# Patient Record
Sex: Female | Born: 2000
Health system: Southern US, Community
[De-identification: ages and names within clinical notes are randomized; demographics above are authoritative.]

## PROBLEM LIST (undated history)

## (undated) DIAGNOSIS — Z789 Other specified health status: Secondary | ICD-10-CM

## (undated) HISTORY — PX: NO PAST SURGERIES: SHX2092

---
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
## 2021-02-25 DIAGNOSIS — Z3490 Encounter for supervision of normal pregnancy, unspecified, unspecified trimester: Secondary | ICD-10-CM | POA: Insufficient documentation

## 2021-02-26 ENCOUNTER — Ambulatory Visit (INDEPENDENT_AMBULATORY_CARE_PROVIDER_SITE_OTHER): Payer: 59

## 2021-02-26 DIAGNOSIS — Z3490 Encounter for supervision of normal pregnancy, unspecified, unspecified trimester: Secondary | ICD-10-CM

## 2021-02-26 DIAGNOSIS — Z3482 Encounter for supervision of other normal pregnancy, second trimester: Secondary | ICD-10-CM

## 2021-02-26 DIAGNOSIS — Z3A16 16 weeks gestation of pregnancy: Secondary | ICD-10-CM

## 2021-02-26 MED ORDER — BLOOD PRESSURE KIT DEVI: 1.0000 | 0 refills | Status: DC

## 2021-02-26 NOTE — Progress Notes (Signed)
  Virtual Visit via Telephone Note  I connected with Becky Crosby on 02/26/21 at  2:00 PM EST by telephone and verified that I am speaking with the correct person using two identifiers.  Location: Patient: HOME Provider: [facility]   I discussed the limitations, risks, security and privacy concerns of performing an evaluation and management service by telephone and the availability of in person appointments. I also discussed with the patient that there may be a patient responsible charge related to this service. The patient expressed understanding and agreed to proceed.   History of Present Illness: PRENATAL INTAKE SUMMARY  Ms. Becky Crosby presents today New OB Nurse Interview.  OB History    Gravida  2   Para      Term      Preterm      AB  1   Living  0     SAB      IAB      Ectopic      Multiple      Live Births             I have reviewed the patient's medical, obstetrical, social, and family histories, medications, and available lab results.  SUBJECTIVE She has no unusual complaints   Observations/Objective: Initial nurse interview for history/labs (New OB)  EDD: 08/07/2021 GA: [redacted]w[redacted]d G2 P0 FHT: NA  GENERAL APPEARANCE: oriented to person, place and time  Assessment and Plan: Normal pregnancy Prenatal care: @ [facility] PHq-9=2 Pregnancy Risk Screening done Babyscripts ordered BP Cuff ordered Labs to be completed at next visit with [name] on 03/05/2021.   Follow Up Instructions:   I discussed the assessment and treatment plan with the patient. The patient was provided an opportunity to ask questions and all were answered. The patient agreed with the plan and demonstrated an understanding of the instructions.   The patient was advised to call back or seek an in-person evaluation if the symptoms worsen or if the condition fails to improve as anticipated.  I provided 20 minutes of non-face-to-face time during this encounter.   Maretta Bees, RMA

## 2021-03-05 ENCOUNTER — Other Ambulatory Visit (HOSPITAL_COMMUNITY)
Admission: RE | Admit: 2021-03-05 | Discharge: 2021-03-05 | Disposition: A | Discharge status: Home / Self Care | Payer: 59 | Source: Ambulatory Visit | Attending: Obstetrics and Gynecology | Admitting: Obstetrics and Gynecology

## 2021-03-05 ENCOUNTER — Other Ambulatory Visit (HOSPITAL_COMMUNITY): Payer: Self-pay | Admitting: Obstetrics and Gynecology

## 2021-03-05 ENCOUNTER — Ambulatory Visit (INDEPENDENT_AMBULATORY_CARE_PROVIDER_SITE_OTHER): Payer: 59 | Admitting: Obstetrics and Gynecology

## 2021-03-05 ENCOUNTER — Other Ambulatory Visit: Payer: Self-pay

## 2021-03-05 VITALS — BP 119/74 | HR 81 | Wt 196.0 lb

## 2021-03-05 DIAGNOSIS — Z31430 Encounter of female for testing for genetic disease carrier status for procreative management: Secondary | ICD-10-CM | POA: Diagnosis not present

## 2021-03-05 DIAGNOSIS — Z3402 Encounter for supervision of normal first pregnancy, second trimester: Secondary | ICD-10-CM | POA: Diagnosis not present

## 2021-03-05 DIAGNOSIS — Z3482 Encounter for supervision of other normal pregnancy, second trimester: Secondary | ICD-10-CM | POA: Diagnosis not present

## 2021-03-05 DIAGNOSIS — Z3480 Encounter for supervision of other normal pregnancy, unspecified trimester: Secondary | ICD-10-CM

## 2021-03-05 DIAGNOSIS — Z3A17 17 weeks gestation of pregnancy: Secondary | ICD-10-CM

## 2021-03-05 MED ORDER — PRENAISSANCE BALANCE 30-1-260 MG PO CAPS: 1.0000 | ORAL_CAPSULE | Freq: Every day | ORAL | 13 refills | Status: AC

## 2021-03-05 NOTE — Progress Notes (Signed)
Pt presents today for NOB visit. No complaints from patient.

## 2021-03-05 NOTE — Progress Notes (Signed)
  Subjective:    Becky Crosby is a G2P0010 [redacted]w[redacted]d being seen today for her first obstetrical visit.  Her obstetrical history is significant for teenage pregnancy. Patient does intend to breast feed. Pregnancy history fully reviewed.  Support system includes mom and boyfriend. Pregnancy unplanned but plans to keep baby.  Not Covid vaccinated but is strongly considering.  Mood is good, feeling a little overwhelmed about pregnancy but okay overall. Planning on getting GED.   No tobacco use. No MJ use. Denies any other substance use.    Patient reports no complaints.  Vitals:   03/05/21 0943  BP: 119/74  Pulse: 81  Weight: 196 lb (88.9 kg)    HISTORY: OB History  Gravida Para Term Preterm AB Living  2       1 0  SAB IAB Ectopic Multiple Live Births               # Outcome Date GA Lbr Len/2nd Weight Sex Delivery Anes PTL Lv  2 Current           1 AB            Past Medical History:  Diagnosis Date  . Constipation   . Medical history non-contributory    Past Surgical History:  Procedure Laterality Date  . NO PAST SURGERIES     No family history on file.   Exam     Skin: normal coloration and turgor, no rashes    Neurologic: oriented   Extremities: normal strength, tone, and muscle mass   HEENT PERRLA   Neck supple   Cardiovascular: regular rate and rhythm   Respiratory:  appears well, vitals normal, no respiratory distress, acyanotic, normal RR, ear and throat exam is normal, neck free of mass or lymphadenopathy, chest clear, no wheezing, crepitations, rhonchi, normal symmetric air entry   Abdomen: soft, non-tender; bowel sounds normal; no masses,  no organomegaly      Assessment:    Pregnancy: G2P0010 Patient Active Problem List   Diagnosis Date Noted  . Supervision of normal pregnancy 02/25/2021  . Constipation         Plan:    Supervision of normal Pregnancy, teenage pregnancy  -welcomed to office. Discussed expectations in second trimester. Mood  good overall. Taking PNV.  Initial labs drawn. Problem list reviewed and updated. Genetic Screening discussed NIPS, AFP,  requested.  Ultrasound discussed; fetal survey: requested.  Follow up in 4 weeks.    Gita Kudo 03/05/2021

## 2021-03-05 NOTE — Patient Instructions (Signed)

## 2021-03-06 LAB — CERVICOVAGINAL ANCILLARY ONLY
Chlamydia: NEGATIVE
Comment: NEGATIVE
Comment: NEGATIVE
Comment: NORMAL
Neisseria Gonorrhea: NEGATIVE
Trichomonas: NEGATIVE

## 2021-03-07 LAB — URINE CULTURE, OB REFLEX

## 2021-03-07 LAB — CULTURE, OB URINE

## 2021-03-09 LAB — CBC/D/PLT+RPR+RH+ABO+RUB AB...
Basophils Absolute: 0.1 10*3/uL (ref 0.0–0.2)
Basos: 1 %
EOS (ABSOLUTE): 0.2 10*3/uL (ref 0.0–0.4)
Eos: 2 %
HCV Ab: <0.1 s/co ratio (ref 0.0–0.9)
HIV Screen 4th Generation wRfx: NONREACTIVE
Hematocrit: 35.8 % (ref 34.0–46.6)
Hemoglobin: 12.2 g/dL (ref 11.1–15.9)
Hepatitis B Surface Ag: NEGATIVE
Immature Grans (Abs): 0 10*3/uL (ref 0.0–0.1)
Immature Granulocytes: 0 %
Lymphocytes Absolute: 2.7 10*3/uL (ref 0.7–3.1)
Lymphs: 32 %
MCH: 29.6 pg (ref 26.6–33.0)
MCHC: 34.1 g/dL (ref 31.5–35.7)
MCV: 87 fL (ref 79–97)
Monocytes Absolute: 0.5 10*3/uL (ref 0.1–0.9)
Monocytes: 6 %
Neutrophils Absolute: 4.9 10*3/uL (ref 1.4–7.0)
Neutrophils: 59 %
Platelets: 268 10*3/uL (ref 150–450)
RBC: 4.12 x10E6/uL (ref 3.77–5.28)
RDW: 13.2 % (ref 11.7–15.4)
RPR Ser Ql: NONREACTIVE
Rh Factor: POSITIVE
Rubella Antibodies, IGG: 7.56 index (ref >0.99)
WBC: 8.4 10*3/uL (ref 3.4–10.8)

## 2021-03-09 LAB — AFP, SERUM, OPEN SPINA BIFIDA
AFP MoM: 1.16
AFP Value: 43.2 ng/mL
Gest. Age on Collection Date: 17.3 weeks
Maternal Age At EDD: 19 yr
OSBR Risk 1 IN: 10000
Test Results:: NEGATIVE
Weight: 196 [lb_av]

## 2021-03-09 LAB — AB SCR+ANTIBODY ID: Antibody Screen: POSITIVE — AB

## 2021-03-09 LAB — HCV INTERPRETATION

## 2021-03-11 ENCOUNTER — Encounter: Payer: Self-pay | Admitting: Obstetrics and Gynecology

## 2021-03-15 ENCOUNTER — Encounter: Payer: Self-pay | Admitting: Obstetrics and Gynecology

## 2021-03-22 ENCOUNTER — Ambulatory Visit: Payer: 59 | Attending: Obstetrics and Gynecology

## 2021-03-22 ENCOUNTER — Other Ambulatory Visit: Payer: Self-pay

## 2021-03-22 DIAGNOSIS — Z3402 Encounter for supervision of normal first pregnancy, second trimester: Secondary | ICD-10-CM | POA: Diagnosis not present

## 2021-03-25 ENCOUNTER — Other Ambulatory Visit: Payer: Self-pay | Admitting: *Deleted

## 2021-03-25 DIAGNOSIS — O4402 Placenta previa specified as without hemorrhage, second trimester: Secondary | ICD-10-CM

## 2021-04-02 ENCOUNTER — Other Ambulatory Visit: Payer: Self-pay

## 2021-04-02 ENCOUNTER — Ambulatory Visit (INDEPENDENT_AMBULATORY_CARE_PROVIDER_SITE_OTHER): Payer: 59 | Admitting: Nurse Practitioner

## 2021-04-02 VITALS — BP 119/71 | HR 7 | Wt 201.0 lb

## 2021-04-02 DIAGNOSIS — Z3490 Encounter for supervision of normal pregnancy, unspecified, unspecified trimester: Secondary | ICD-10-CM

## 2021-04-02 DIAGNOSIS — Z3A21 21 weeks gestation of pregnancy: Secondary | ICD-10-CM

## 2021-04-02 NOTE — Progress Notes (Signed)
    Subjective:  Becky Crosby is a 20 y.o. G2P0010 at [redacted]w[redacted]d being seen today for ongoing prenatal care.  She is currently monitored for the following issues for this low-risk pregnancy and has Constipation; Supervision of normal pregnancy; and Intrauterine pregnancy in teenager on their problem list.  Patient reports no complaints.  Contractions: Not present. Vag. Bleeding: None.  Movement: Present. Denies leaking of fluid.   The following portions of the patient's history were reviewed and updated as appropriate: allergies, current medications, past family history, past medical history, past social history, past surgical history and problem list. Problem list updated.  Objective:   Vitals:   04/02/21 0944  BP: 119/71  Pulse: (!) 7  Weight: 201 lb (91.2 kg)    Fetal Status: Fetal Heart Rate (bpm): 152   Movement: Present     General:  Alert, oriented and cooperative. Patient is in no acute distress.  Skin: Skin is warm and dry. No rash noted.   Cardiovascular: Normal heart rate noted  Respiratory: Normal respiratory effort, no problems with respiration noted  Abdomen: Soft, gravid, appropriate for gestational age. Pain/Pressure: Absent     Pelvic:  Cervical exam deferred        Extremities: Normal range of motion.  Edema: None  Mental Status: Normal mood and affect. Normal behavior. Normal judgment and thought content.   Urinalysis:      Assessment and Plan:  Pregnancy: G2P0010 at [redacted]w[redacted]d  1. Encounter for supervision of normal pregnancy, antepartum, unspecified gravidity Nausea has improved - never took Diclegis Constipation has resolve - BM daily Reviewed signing up for childbirth and breastfeeding classes Has low lying placenta and scheduled for repeat US - reviewed pelvic rest for now  - AFP, Serum, Open Spina Bifida   Preterm labor symptoms and general obstetric precautions including but not limited to vaginal bleeding, contractions, leaking of fluid and fetal movement  were reviewed in detail with the patient. Please refer to After Visit Summary for other counseling recommendations.  Return in about 4 weeks (around 04/30/2021) for in person ROB.  Nolene Bernheim, RN, MSN, NP-BC Nurse Practitioner, [facility] [employer], [facility] 04/02/2021 10:21 AM

## 2021-04-02 NOTE — Progress Notes (Signed)
ROB 21w  CC: None       

## 2021-04-02 NOTE — Patient Instructions (Addendum)
Check [facility] for information on classes for childbirth and breastfeeding   Second Trimester of Pregnancy  The second trimester of pregnancy is from week 13 through week 27. This is also called months 4 through 6 of pregnancy. This is often the time when you feel your best. During the second trimester:  Morning sickness is less or has stopped.  You may have more energy.  You may feel hungry more often. At this time, your unborn baby (fetus) is growing very fast. At the end of the sixth month, the unborn baby may be up to 12 inches long and weigh about 1 pounds. You will likely start to feel the baby move between 16 and 20 weeks of pregnancy. Body changes during your second trimester Your body continues to go through many changes during this time. The changes vary and generally return to normal after the baby is born. Physical changes  You will gain more weight.  You may start to get stretch marks on your hips, belly (abdomen), and breasts.  Your breasts will grow and may hurt.  Dark spots or blotches may develop on your face.  A dark line from your belly button to the pubic area (linea nigra) may appear.  You may have changes in your hair. Health changes  You may have headaches.  You may have heartburn.  You may have trouble pooping (constipation).  You may have hemorrhoids or swollen, bulging veins (varicose veins).  Your gums may bleed.  You may pee (urinate) more often.  You may have back pain. Follow these instructions at home: Medicines  Take over-the-counter and prescription medicines only as told by your doctor. Some medicines are not safe during pregnancy.  Take a prenatal vitamin that contains at least 600 micrograms (mcg) of folic acid. Eating and drinking  Eat healthy meals that include: ? Fresh fruits and vegetables. ? Whole grains. ? Good sources of protein, such as meat, eggs, or tofu. ? Low-fat dairy products.  Avoid raw meat and  unpasteurized juice, milk, and cheese.  You may need to take these actions to prevent or treat trouble pooping: ? Drink enough fluids to keep your pee (urine) pale yellow. ? Eat foods that are high in fiber. These include beans, whole grains, and fresh fruits and vegetables. ? Limit foods that are high in fat and sugar. These include fried or sweet foods. Activity  Exercise only as told by your doctor. Most people can do their usual exercise during pregnancy. Try to exercise for 30 minutes at least 5 days a week.  Stop exercising if you have pain or cramps in your belly or lower back.  Do not exercise if it is too hot or too humid, or if you are in a place of great height (high altitude).  Avoid heavy lifting.  If you choose to, you may have sex unless your doctor tells you not to. Relieving pain and discomfort  Wear a good support bra if your breasts are sore.  Take warm water baths (sitz baths) to soothe pain or discomfort caused by hemorrhoids. Use hemorrhoid cream if your doctor approves.  Rest with your legs raised (elevated) if you have leg cramps or low back pain.  If you develop bulging veins in your legs: ? Wear support hose as told by your doctor. ? Raise your feet for 15 minutes, 3-4 times a day. ? Limit salt in your food. Safety  Wear your seat belt at all times when you are in a car.  Talk with your doctor if someone is hurting you or yelling at you a lot. Lifestyle  Do not use hot tubs, steam rooms, or saunas.  Do not douche. Do not use tampons or scented sanitary pads.  Avoid cat litter boxes and soil used by cats. These carry germs that can harm your baby and can cause a loss of your baby by miscarriage or stillbirth.  Do not use herbal medicines, illegal drugs, or medicines that are not approved by your doctor. Do not drink alcohol.  Do not smoke or use any products that contain nicotine or tobacco. If you need help quitting, ask your doctor. General  instructions  Keep all follow-up visits. This is important.  Ask your doctor about local prenatal classes.  Ask your doctor about the right foods to eat or for help finding a counselor. Where to find more information  American Pregnancy Association: americanpregnancy.org  [employer]: www.acog.org  Office on [employer] [facility]: [employer] Contact a doctor if:  You have a headache that does not go away when you take medicine.  You have changes in how you see, or you see spots in front of your eyes.  You have mild cramps, pressure, or pain in your lower belly.  You continue to feel like you may vomit (nauseous), you vomit, or you have watery poop (diarrhea).  You have bad-smelling fluid coming from your vagina.  You have pain when you pee or your pee smells bad.  You have very bad swelling of your face, hands, ankles, feet, or legs.  You have a fever. Get help right away if:  You are leaking fluid from your vagina.  You have spotting or bleeding from your vagina.  You have very bad belly cramping or pain.  You have trouble breathing.  You have chest pain.  You faint.  You have not felt your baby move for the time period told by your doctor.  You have new or increased pain, swelling, or redness in an arm or leg. Summary  The second trimester of pregnancy is from week 13 through week 27 (months 4 through 6).  Eat healthy meals.  Exercise as told by your doctor. Most people can do their usual exercise during pregnancy.  Do not use herbal medicines, illegal drugs, or medicines that are not approved by your doctor. Do not drink alcohol.  Call your doctor if you get sick or if you notice anything unusual about your pregnancy. This information is not intended to replace advice given to you by your health care provider. Make sure you discuss any questions you have with your health care provider. Document Revised:  05/16/2020 Document Reviewed: 03/22/2020 Elsevier Patient Education  2021 [employer].

## 2021-04-04 LAB — AFP, SERUM, OPEN SPINA BIFIDA
AFP MoM: 0.97
AFP Value: 62.6 ng/mL
Gest. Age on Collection Date: 21.2 weeks
Maternal Age At EDD: 19 yr
OSBR Risk 1 IN: 10000
Test Results:: NEGATIVE
Weight: 201 [lb_av]

## 2021-04-19 ENCOUNTER — Ambulatory Visit: Payer: 59 | Attending: Obstetrics and Gynecology

## 2021-04-19 ENCOUNTER — Other Ambulatory Visit: Payer: Self-pay

## 2021-04-19 ENCOUNTER — Ambulatory Visit: Payer: 59 | Admitting: *Deleted

## 2021-04-19 ENCOUNTER — Encounter: Payer: Self-pay | Admitting: *Deleted

## 2021-04-19 VITALS — BP 121/54 | HR 63

## 2021-04-19 DIAGNOSIS — Z3A23 23 weeks gestation of pregnancy: Secondary | ICD-10-CM

## 2021-04-19 DIAGNOSIS — O4402 Placenta previa specified as without hemorrhage, second trimester: Secondary | ICD-10-CM

## 2021-04-22 ENCOUNTER — Other Ambulatory Visit: Payer: Self-pay | Admitting: *Deleted

## 2021-05-02 ENCOUNTER — Ambulatory Visit (INDEPENDENT_AMBULATORY_CARE_PROVIDER_SITE_OTHER): Payer: 59 | Admitting: Obstetrics and Gynecology

## 2021-05-02 ENCOUNTER — Encounter: Payer: Self-pay | Admitting: Obstetrics and Gynecology

## 2021-05-02 ENCOUNTER — Other Ambulatory Visit: Payer: Self-pay

## 2021-05-02 ENCOUNTER — Other Ambulatory Visit (HOSPITAL_COMMUNITY): Payer: Self-pay

## 2021-05-02 VITALS — BP 116/71 | HR 79 | Wt 215.0 lb

## 2021-05-02 DIAGNOSIS — O2602 Excessive weight gain in pregnancy, second trimester: Secondary | ICD-10-CM

## 2021-05-02 DIAGNOSIS — Z3402 Encounter for supervision of normal first pregnancy, second trimester: Secondary | ICD-10-CM

## 2021-05-02 MED ORDER — CITRANATAL BLOOM 90-1 MG PO TABS
1.0000 | ORAL_TABLET | Freq: Every day | ORAL | 11 refills | Status: DC
  Filled 2021-05-02: qty 30, 30d supply, fill #0

## 2021-05-02 NOTE — Progress Notes (Signed)
ROB is taking the CitraNatal PNV and she is feeling better.

## 2021-05-02 NOTE — Progress Notes (Signed)
   PRENATAL VISIT NOTE  Subjective:  Becky Crosby is a 20 y.o. G2P0010 at [redacted]w[redacted]d being seen today for ongoing prenatal care.  She is currently monitored for the following issues for this low-risk pregnancy and has Constipation; Supervision of normal pregnancy; and Intrauterine pregnancy in teenager on their problem list.  Patient reports no complaints.  Contractions: Not present. Vag. Bleeding: None.  Movement: Present. Denies leaking of fluid.   The following portions of the patient's history were reviewed and updated as appropriate: allergies, current medications, past family history, past medical history, past social history, past surgical history and problem list.   Objective:   Vitals:   05/02/21 1437  BP: 116/71  Pulse: 79  Weight: 215 lb (97.5 kg)    Fetal Status: Fetal Heart Rate (bpm): 142 Fundal Height: 26 cm Movement: Present     General:  Alert, oriented and cooperative. Patient is in no acute distress.  Skin: Skin is warm and dry. No rash noted.   Cardiovascular: Normal heart rate noted  Respiratory: Normal respiratory effort, no problems with respiration noted  Abdomen: Soft, gravid, appropriate for gestational age.  Pain/Pressure: Absent     Pelvic: Cervical exam deferred        Extremities: Normal range of motion.  Edema: Trace  Mental Status: Normal mood and affect. Normal behavior. Normal judgment and thought content.   Assessment and Plan:  Pregnancy: G2P0010 at [redacted]w[redacted]d 1. Encounter for supervision of normal first pregnancy in second trimester Patient is doing well  Third trimester labs with glucola next visit Reviewed last ultrasound and resolution of low-lying placenta Follow up growth ultrasound scheduled  2. Excessive weight gain during pregnancy in second trimester Reviewed recommended weight gain in pregnancy Discussed increasing risk of GDM, and hypertensive disease in pregnancy  Preterm labor symptoms and general obstetric precautions including but  not limited to vaginal bleeding, contractions, leaking of fluid and fetal movement were reviewed in detail with the patient. Please refer to After Visit Summary for other counseling recommendations.   Return in about 4 weeks (around 05/30/2021) for in person, ROB, Low risk, 2 hr glucola next visit.  Future Appointments  Date Time Provider Department Center  05/24/2021 12:30 PM [facility] NURSE [facility]  05/24/2021 12:45 PM [facility]    Catalina Antigua, MD

## 2021-05-10 ENCOUNTER — Other Ambulatory Visit (HOSPITAL_COMMUNITY): Payer: Self-pay

## 2021-05-24 ENCOUNTER — Other Ambulatory Visit: Payer: Self-pay

## 2021-05-24 ENCOUNTER — Encounter: Payer: Self-pay | Admitting: *Deleted

## 2021-05-24 ENCOUNTER — Ambulatory Visit: Payer: 59 | Admitting: *Deleted

## 2021-05-24 ENCOUNTER — Ambulatory Visit: Payer: 59 | Attending: Obstetrics

## 2021-05-24 VITALS — BP 125/61 | HR 70

## 2021-05-24 DIAGNOSIS — O365930 Maternal care for other known or suspected poor fetal growth, third trimester, not applicable or unspecified: Secondary | ICD-10-CM

## 2021-05-24 DIAGNOSIS — Z362 Encounter for other antenatal screening follow-up: Secondary | ICD-10-CM

## 2021-05-24 DIAGNOSIS — Z3A28 28 weeks gestation of pregnancy: Secondary | ICD-10-CM | POA: Diagnosis not present

## 2021-05-30 ENCOUNTER — Other Ambulatory Visit: Payer: Self-pay

## 2021-05-30 ENCOUNTER — Ambulatory Visit (INDEPENDENT_AMBULATORY_CARE_PROVIDER_SITE_OTHER): Payer: 59

## 2021-05-30 ENCOUNTER — Other Ambulatory Visit: Payer: 59

## 2021-05-30 VITALS — BP 116/64 | HR 57 | Wt 220.2 lb

## 2021-05-30 DIAGNOSIS — Z7689 Persons encountering health services in other specified circumstances: Secondary | ICD-10-CM | POA: Diagnosis not present

## 2021-05-30 DIAGNOSIS — Z3402 Encounter for supervision of normal first pregnancy, second trimester: Secondary | ICD-10-CM

## 2021-05-30 DIAGNOSIS — Z3A29 29 weeks gestation of pregnancy: Secondary | ICD-10-CM

## 2021-05-30 DIAGNOSIS — Z23 Encounter for immunization: Secondary | ICD-10-CM

## 2021-05-30 NOTE — Patient Instructions (Signed)

## 2021-05-30 NOTE — Progress Notes (Signed)
   LOW-RISK PREGNANCY OFFICE VISIT  Patient name: Becky Crosby MRN [id number]  Date of birth: [birth year] Chief Complaint:   Routine Prenatal Visit  Subjective:   Becky Crosby is a 20 y.o. G58P0010 female at [redacted]w[redacted]d with an Estimated Date of Delivery: 08/11/21 being seen today for ongoing management of a low-risk pregnancy aeb has Constipation; Supervision of normal pregnancy; and Intrauterine pregnancy in teenager on their problem list.  Patient presents today with no complaints.  Patient endorses fetal movement. Patient denies abdominal cramping or contractions.  Patient denies vaginal concerns including abnormal discharge, leaking of fluid, and bleeding.  Contractions: Not present. Vag. Bleeding: None.  Movement: Present.  Patient questions costs of circumcision.  States she was told it would be around $500.  Reviewed past medical,surgical, social, obstetrical and family history as well as problem list, medications and allergies.  Objective   Vitals:   05/30/21 0952  BP: 116/64  Pulse: (!) 57  Weight: 220 lb 3.2 oz (99.9 kg)  There is no height or weight on file to calculate BMI.  Total Weight Gain:96 lb 3.2 oz (43.6 kg)         Physical Examination:   General appearance: Well appearing, and in no distress  Mental status: Alert, oriented to person, place, and time  Skin: Warm & dry  Cardiovascular: Normal heart rate noted  Respiratory: Normal respiratory effort, no distress  Abdomen: Soft, gravid, nontender, AGA with Fundal Height: 30 cm  Pelvic: Cervical exam deferred           Extremities: Edema: None  Fetal Status:    Movement: Present   No results found for this or any previous visit (from the past 24 hour(s)).  Assessment & Plan:  Low-risk pregnancy of a 20 y.o., G2P0010 at [redacted]w[redacted]d with an Estimated Date of Delivery: 08/11/21   1. Encounter for supervision of normal first pregnancy in second trimester -Anticipatory guidance for upcoming appts. -Patient to schedule next  appt in 2 weeks for an in-person visit.   2. [redacted] weeks gestation of pregnancy -Doing well overall. -Reviewed Glucola blood draw procedures and labs which also include check of iron level and RPR/HIV.  -Discussed how results of GTT are handled including diabetic education and BS testing for abnormal results and routine care for normal results.  -Reassured that her private [facility])  insurance will cover costs of circumcision less any deductibles.    Meds: No orders of the defined types were placed in this encounter.  Labs/procedures today:  Lab Orders  Glucose Tolerance, 2 Hours w/1 Hour  CBC  HIV antibody (with reflex)  RPR     Reviewed: Preterm labor symptoms and general obstetric precautions including but not limited to vaginal bleeding, contractions, leaking of fluid and fetal movement were reviewed in detail with the patient.  All questions were answered.  Follow-up: Return in about 2 weeks (around 06/13/2021) for LROB.  Orders Placed This Encounter  Procedures   Tdap vaccine greater than or equal to 7yo IM   Glucose Tolerance, 2 Hours w/1 Hour   CBC   HIV antibody (with reflex)   RPR   Cherre Robins MSN, CNM 05/30/2021

## 2021-05-30 NOTE — Progress Notes (Signed)
ROB 29.4wks GTT today TDAP offered and accepted Routine Labs pended. Depression and Anxiety screen negative

## 2021-05-31 LAB — CBC
Hematocrit: 36.5 % (ref 34.0–46.6)
Hemoglobin: 12.3 g/dL (ref 11.1–15.9)
MCH: 30.4 pg (ref 26.6–33.0)
MCHC: 33.7 g/dL (ref 31.5–35.7)
MCV: 90 fL (ref 79–97)
Platelets: 238 10*3/uL (ref 150–450)
RBC: 4.05 x10E6/uL (ref 3.77–5.28)
RDW: 12.1 % (ref 11.7–15.4)
WBC: 9.3 10*3/uL (ref 3.4–10.8)

## 2021-05-31 LAB — GLUCOSE TOLERANCE, 2 HOURS W/ 1HR
Glucose, 1 hour: 72 mg/dL (ref 65–179)
Glucose, 2 hour: 90 mg/dL (ref 65–152)
Glucose, Fasting: 74 mg/dL (ref 65–91)

## 2021-05-31 LAB — HIV ANTIBODY (ROUTINE TESTING W REFLEX): HIV Screen 4th Generation wRfx: NONREACTIVE

## 2021-05-31 LAB — RPR: RPR Ser Ql: NONREACTIVE

## 2021-06-13 ENCOUNTER — Other Ambulatory Visit: Payer: Self-pay

## 2021-06-13 ENCOUNTER — Ambulatory Visit (INDEPENDENT_AMBULATORY_CARE_PROVIDER_SITE_OTHER): Payer: 59

## 2021-06-13 VITALS — BP 119/65 | HR 72 | Wt 220.0 lb

## 2021-06-13 DIAGNOSIS — Z3480 Encounter for supervision of other normal pregnancy, unspecified trimester: Secondary | ICD-10-CM

## 2021-06-13 DIAGNOSIS — Z3A31 31 weeks gestation of pregnancy: Secondary | ICD-10-CM

## 2021-06-13 DIAGNOSIS — M549 Dorsalgia, unspecified: Secondary | ICD-10-CM

## 2021-06-13 DIAGNOSIS — O26893 Other specified pregnancy related conditions, third trimester: Secondary | ICD-10-CM

## 2021-06-13 NOTE — Progress Notes (Signed)
Pt states she is having some left rib/back pain. Pt denies any urinary problems.  Pt states she has some heartburn/reflux with certain foods.

## 2021-06-13 NOTE — Progress Notes (Signed)
   LOW-RISK PREGNANCY OFFICE VISIT  Patient name: Becky Crosby MRN [id number]  Date of birth: [birth year] Chief Complaint:   Routine Prenatal Visit  Subjective:   Becky Crosby is a 20 y.o. G81P0010 female at [redacted]w[redacted]d with an Estimated Date of Delivery: 08/11/21 being seen today for ongoing management of a low-risk pregnancy aeb has Constipation; Supervision of normal pregnancy; and Intrauterine pregnancy in teenager on their problem list.  Patient presents today with heartburn.  She states that "sometimes when I eat certain things I feel like it is not going down... like it is stuck." She also reports some back pain that is located "in the back of my rib."  She reports the pain lasts for "hours" and feels "like a knot inside somewhere." She reports it is relieved with laying down. Patient endorses fetal movement. Patient denies abdominal cramping or contractions.  Patient denies vaginal concerns including abnormal discharge, leaking of fluid, and bleeding.  Contractions: Not present. Vag. Bleeding: None.  Movement: Present.  Reviewed past medical,surgical, social, obstetrical and family history as well as problem list, medications and allergies.  Objective   Vitals:   06/13/21 1440  BP: 119/65  Pulse: 72  Weight: 220 lb (99.8 kg)  There is no height or weight on file to calculate BMI.  Total Weight Gain:24 lb (10.9 kg)         Physical Examination:   General appearance: Well appearing, and in no distress  Mental status: Alert, oriented to person, place, and time  Skin: Warm & dry  Cardiovascular: Normal heart rate noted  Respiratory: Normal respiratory effort, no distress  Abdomen: Soft, gravid, nontender, AGA with Fundal Height: 32 cm  Pelvic: Cervical exam deferred           Extremities:    Fetal Status: Fetal Heart Rate (bpm): 140  Movement: Present   No results found for this or any previous visit (from the past 24 hour(s)).  Assessment & Plan:  Low-risk pregnancy of a 20 y.o.,  G2P0010 at [redacted]w[redacted]d with an Estimated Date of Delivery: 08/11/21   1. Supervision of other normal pregnancy, antepartum -Anticipatory guidance for upcoming appts. -Patient to schedule next appt in 2-3 weeks for an in-person or virtual visit as desired.   2. [redacted] weeks gestation of pregnancy -Doing well overall. -Patient expresses some anxiety with her mother, who is present today, being [place] of [place] from [place]. -Reassured that most first babies come after due date, but encouraged to have back up plan.    3. Back pain during pregnancy in third trimester -Reviewed complaints. -Informed of non-pharmacological methods to reduce back pain. -Encouraged to monitor and report any worsening symptoms or those not relieved with position changes. -Discussed usage of maternity belt/band.     Meds: No orders of the defined types were placed in this encounter.  Labs/procedures today:  Lab Orders  No laboratory test(s) ordered today     Reviewed: Preterm labor symptoms and general obstetric precautions including but not limited to vaginal bleeding, contractions, leaking of fluid and fetal movement were reviewed in detail with the patient.  All questions were answered.  Follow-up: Return in about 2 weeks (around 06/27/2021), or 2-3, for LROB.  No orders of the defined types were placed in this encounter.  Cherre Robins MSN, CNM 06/13/2021

## 2021-06-27 ENCOUNTER — Encounter: Payer: 59 | Admitting: Advanced Practice Midwife

## 2021-07-01 ENCOUNTER — Other Ambulatory Visit: Payer: Self-pay

## 2021-07-01 ENCOUNTER — Ambulatory Visit (INDEPENDENT_AMBULATORY_CARE_PROVIDER_SITE_OTHER): Payer: 59 | Admitting: Advanced Practice Midwife

## 2021-07-01 VITALS — BP 124/71 | HR 66 | Wt 225.0 lb

## 2021-07-01 DIAGNOSIS — Z3483 Encounter for supervision of other normal pregnancy, third trimester: Secondary | ICD-10-CM

## 2021-07-01 DIAGNOSIS — Z3480 Encounter for supervision of other normal pregnancy, unspecified trimester: Secondary | ICD-10-CM

## 2021-07-01 DIAGNOSIS — Z3A34 34 weeks gestation of pregnancy: Secondary | ICD-10-CM

## 2021-07-01 NOTE — Progress Notes (Signed)
Patient presents for a ROB. Patient has no concerns today.

## 2021-07-01 NOTE — Progress Notes (Signed)
   PRENATAL VISIT NOTE  Subjective:  Becky Crosby is a 20 y.o. G2P0010 at [redacted]w[redacted]d being seen today for ongoing prenatal care.  She is currently monitored for the following issues for this low-risk pregnancy and has Constipation; Supervision of normal pregnancy; and Intrauterine pregnancy in teenager on their problem list.  Patient reports no complaints.  Contractions: Not present. Vag. Bleeding: None.  Movement: Present. Denies leaking of fluid.   The following portions of the patient's history were reviewed and updated as appropriate: allergies, current medications, past family history, past medical history, past social history, past surgical history and problem list.   Objective:   Vitals:   07/01/21 1612  BP: 124/71  Pulse: 66  Weight: 225 lb (102.1 kg)    Fetal Status:     Movement: Present     General:  Alert, oriented and cooperative. Patient is in no acute distress.  Skin: Skin is warm and dry. No rash noted.   Cardiovascular: Normal heart rate noted  Respiratory: Normal respiratory effort, no problems with respiration noted  Abdomen: Soft, gravid, appropriate for gestational age.  Pain/Pressure: Absent     Pelvic: Cervical exam deferred        Extremities: Normal range of motion.  Edema: None  Mental Status: Normal mood and affect. Normal behavior. Normal judgment and thought content.   Assessment and Plan:  Pregnancy: G2P0010 at [redacted]w[redacted]d 1. Supervision of other normal pregnancy, antepartum --Anticipatory guidance about next visits/weeks of pregnancy given. --EFW 5.5 lbs by [place] at pt request today, appropriate for gestational age --Next visit in 2 weeks  2. [redacted] weeks gestation of pregnancy   Preterm labor symptoms and general obstetric precautions including but not limited to vaginal bleeding, contractions, leaking of fluid and fetal movement were reviewed in detail with the patient. Please refer to After Visit Summary for other counseling recommendations.   Return  in about 2 weeks (around 07/15/2021).  Future Appointments  Date Time Provider Department Center  07/15/2021  3:40 PM [place], Wilmer Floor, CNM [facility] None    Sharen Counter, CNM

## 2021-07-15 ENCOUNTER — Encounter: Payer: 59 | Admitting: Advanced Practice Midwife

## 2021-07-30 ENCOUNTER — Ambulatory Visit (INDEPENDENT_AMBULATORY_CARE_PROVIDER_SITE_OTHER): Payer: 59 | Admitting: Obstetrics and Gynecology

## 2021-07-30 ENCOUNTER — Other Ambulatory Visit (HOSPITAL_COMMUNITY)
Admission: RE | Admit: 2021-07-30 | Discharge: 2021-07-30 | Disposition: A | Discharge status: Home / Self Care | Payer: 59 | Source: Ambulatory Visit | Attending: Advanced Practice Midwife | Admitting: Advanced Practice Midwife

## 2021-07-30 ENCOUNTER — Other Ambulatory Visit: Payer: Self-pay

## 2021-07-30 VITALS — BP 105/75 | HR 60 | Wt 235.0 lb

## 2021-07-30 DIAGNOSIS — Z3480 Encounter for supervision of other normal pregnancy, unspecified trimester: Secondary | ICD-10-CM | POA: Diagnosis not present

## 2021-07-30 NOTE — Progress Notes (Signed)
   PRENATAL VISIT NOTE  Subjective:  Becky Crosby is a 20 y.o. G2P0010 at [redacted]w[redacted]d being seen today for ongoing prenatal care.  She is currently monitored for the following issues for this low-risk pregnancy and has Constipation; Supervision of normal pregnancy; and Intrauterine pregnancy in teenager on their problem list.  Patient reports no complaints.  Contractions: Irritability. Vag. Bleeding: None.  Movement: Present. Denies leaking of fluid.   The following portions of the patient's history were reviewed and updated as appropriate: allergies, current medications, past family history, past medical history, past social history, past surgical history and problem list.   Objective:   Vitals:   07/30/21 0946  BP: 105/75  Pulse: 60  Weight: 235 lb (106.6 kg)    Fetal Status: Fetal Heart Rate (bpm): 141 Fundal Height: 38 cm Movement: Present  Presentation: Vertex  General:  Alert, oriented and cooperative. Patient is in no acute distress.  Skin: Skin is warm and dry. No rash noted.   Cardiovascular: Normal heart rate noted  Respiratory: Normal respiratory effort, no problems with respiration noted  Abdomen: Soft, gravid, appropriate for gestational age.  Pain/Pressure: Present     Pelvic: Dilation: Fingertip Effacement (%): 50 Station: -3  Extremities: Normal range of motion.  Edema: None  Mental Status: Normal mood and affect. Normal behavior. Normal judgment and thought content.   Assessment and Plan:  Pregnancy: G2P0010 at [redacted]w[redacted]d  1. Supervision of other normal pregnancy, antepartum  -Add magnesium at night for rest, muscle pain -1 week visit. -GC/ GBS today, missed last patient  - Cervicovaginal ancillary only( [place]) - Culture, beta strep (group b only)   Term labor symptoms and general obstetric precautions including but not limited to vaginal bleeding, contractions, leaking of fluid and fetal movement were reviewed in detail with the patient. Please refer to After  Visit Summary for other counseling recommendations.   No follow-ups on file.  Future Appointments  Date Time Provider Department Center  08/07/2021  9:35 AM Warden Fillers, MD [facility] None    Venia Carbon, NP

## 2021-07-30 NOTE — Progress Notes (Signed)
ROB 38.2 wks Missed 36 wk appt due to Covid  Needs GBS and GC/CC. Requests SVE.

## 2021-07-31 ENCOUNTER — Other Ambulatory Visit: Payer: Self-pay | Admitting: Obstetrics and Gynecology

## 2021-07-31 ENCOUNTER — Telehealth: Payer: Self-pay | Admitting: Obstetrics and Gynecology

## 2021-07-31 ENCOUNTER — Encounter: Payer: Self-pay | Admitting: Obstetrics and Gynecology

## 2021-07-31 DIAGNOSIS — A749 Chlamydial infection, unspecified: Secondary | ICD-10-CM | POA: Insufficient documentation

## 2021-07-31 LAB — CERVICOVAGINAL ANCILLARY ONLY
Chlamydia: POSITIVE — AB
Comment: NEGATIVE
Comment: NORMAL
Neisseria Gonorrhea: NEGATIVE

## 2021-07-31 MED ORDER — AZITHROMYCIN 500 MG PO TABS
1000.0000 mg | ORAL_TABLET | Freq: Once | ORAL | 0 refills | Status: DC
  Filled 2021-07-31: qty 2, 1d supply, fill #0

## 2021-07-31 MED ORDER — AZITHROMYCIN 500 MG PO TABS: 1000.0000 mg | ORAL_TABLET | Freq: Once | ORAL | 0 refills | Status: AC

## 2021-07-31 NOTE — Telephone Encounter (Signed)
+   chlamydia  Attempted to call patient with no answer. Message left to return call to [phone]- [facility] Rx: Azithromycin    Deshara Rossi, Harolyn Rutherford, NP 07/31/2021 7:01 PM

## 2021-08-01 ENCOUNTER — Other Ambulatory Visit (HOSPITAL_COMMUNITY): Payer: Self-pay

## 2021-08-02 ENCOUNTER — Encounter: Payer: Self-pay | Admitting: Obstetrics and Gynecology

## 2021-08-02 DIAGNOSIS — B951 Streptococcus, group B, as the cause of diseases classified elsewhere: Secondary | ICD-10-CM | POA: Insufficient documentation

## 2021-08-02 LAB — CULTURE, BETA STREP (GROUP B ONLY): Strep Gp B Culture: POSITIVE — AB

## 2021-08-07 ENCOUNTER — Ambulatory Visit (INDEPENDENT_AMBULATORY_CARE_PROVIDER_SITE_OTHER): Payer: 59 | Admitting: Obstetrics and Gynecology

## 2021-08-07 ENCOUNTER — Other Ambulatory Visit: Payer: Self-pay | Admitting: Advanced Practice Midwife

## 2021-08-07 ENCOUNTER — Other Ambulatory Visit: Payer: Self-pay

## 2021-08-07 VITALS — BP 128/82 | HR 69 | Wt 242.2 lb

## 2021-08-07 DIAGNOSIS — Z3483 Encounter for supervision of other normal pregnancy, third trimester: Secondary | ICD-10-CM

## 2021-08-07 DIAGNOSIS — B951 Streptococcus, group B, as the cause of diseases classified elsewhere: Secondary | ICD-10-CM

## 2021-08-07 DIAGNOSIS — Z3A39 39 weeks gestation of pregnancy: Secondary | ICD-10-CM

## 2021-08-07 NOTE — Progress Notes (Signed)
   PRENATAL VISIT NOTE  Subjective:  Becky Crosby is a 20 y.o. G2P0010 at [redacted]w[redacted]d being seen today for ongoing prenatal care.  She is currently monitored for the following issues for this low-risk pregnancy and has Constipation; Supervision of normal pregnancy; Intrauterine pregnancy in teenager; Chlamydia; Positive GBS test; and [redacted] weeks gestation of pregnancy on their problem list.  Patient doing well with no acute concerns today. She reports no complaints.  Contractions: Not present. Vag. Bleeding: None.  Movement: Present. Denies leaking of fluid.   Pt is aware of positive chlamydia.  She already took medication.  Pt reassured this will not change delivery route/plan.  Discussed IOL for postdates.  The following portions of the patient's history were reviewed and updated as appropriate: allergies, current medications, past family history, past medical history, past social history, past surgical history and problem list. Problem list updated.  Objective:   Vitals:   08/07/21 0948  BP: 128/82  Pulse: 69  Weight: 242 lb 3.2 oz (109.9 kg)    Fetal Status: Fetal Heart Rate (bpm): 154 Fundal Height: 39 cm Movement: Present     General:  Alert, oriented and cooperative. Patient is in no acute distress.  Skin: Skin is warm and dry. No rash noted.   Cardiovascular: Normal heart rate noted  Respiratory: Normal respiratory effort, no problems with respiration noted  Abdomen: Soft, gravid, appropriate for gestational age.  Pain/Pressure: Absent     Pelvic: Cervical exam deferred        Extremities: Normal range of motion.  Edema: None  Mental Status:  Normal mood and affect. Normal behavior. Normal judgment and thought content.   Assessment and Plan:  Pregnancy: G2P0010 at [redacted]w[redacted]d  1. [redacted] weeks gestation of pregnancy   2. Encounter for supervision of other normal pregnancy in third trimester Pt to follow up in 1 week for ob visit and NST for postdates, will schedule IOL at 40.5 or 40.6  weeks  3. Positive GBS test Treat in labor  Term labor symptoms and general obstetric precautions including but not limited to vaginal bleeding, contractions, leaking of fluid and fetal movement were reviewed in detail with the patient.  Please refer to After Visit Summary for other counseling recommendations.   Return in about 6 days (around 08/13/2021) for ROB, in person. NST next visit   Mariel Aloe, MD Faculty Attending Center for [facility]

## 2021-08-07 NOTE — Progress Notes (Signed)
ROB 39.3wks +GBS and +Chlamydia last visit: Took zithromax on 8/9, partner notified and treated.

## 2021-08-08 ENCOUNTER — Encounter (HOSPITAL_COMMUNITY): Payer: Self-pay

## 2021-08-08 ENCOUNTER — Telehealth (HOSPITAL_COMMUNITY): Payer: Self-pay | Admitting: *Deleted

## 2021-08-08 ENCOUNTER — Encounter (HOSPITAL_COMMUNITY): Payer: Self-pay | Admitting: *Deleted

## 2021-08-08 NOTE — Telephone Encounter (Signed)
Preadmission screen  

## 2021-08-09 ENCOUNTER — Other Ambulatory Visit: Payer: Self-pay | Admitting: Obstetrics and Gynecology

## 2021-08-09 ENCOUNTER — Other Ambulatory Visit: Payer: Self-pay

## 2021-08-09 ENCOUNTER — Inpatient Hospital Stay (HOSPITAL_COMMUNITY)
Admission: AD | Admit: 2021-08-09 | Discharge: 2021-08-12 | DRG: 807 | Disposition: A | Discharge status: Home / Self Care | Payer: 59 | Attending: Obstetrics & Gynecology | Admitting: Obstetrics & Gynecology

## 2021-08-09 DIAGNOSIS — O99824 Streptococcus B carrier state complicating childbirth: Secondary | ICD-10-CM | POA: Diagnosis present

## 2021-08-09 DIAGNOSIS — O480 Post-term pregnancy: Principal | ICD-10-CM | POA: Diagnosis present

## 2021-08-09 DIAGNOSIS — Z3A39 39 weeks gestation of pregnancy: Secondary | ICD-10-CM

## 2021-08-09 DIAGNOSIS — A749 Chlamydial infection, unspecified: Secondary | ICD-10-CM

## 2021-08-09 DIAGNOSIS — O326XX0 Maternal care for compound presentation, not applicable or unspecified: Secondary | ICD-10-CM | POA: Diagnosis present

## 2021-08-09 DIAGNOSIS — B951 Streptococcus, group B, as the cause of diseases classified elsewhere: Secondary | ICD-10-CM

## 2021-08-09 LAB — SARS CORONAVIRUS 2 (TAT 6-24 HRS): SARS Coronavirus 2: NEGATIVE

## 2021-08-10 ENCOUNTER — Encounter (HOSPITAL_COMMUNITY): Payer: Self-pay | Admitting: Obstetrics & Gynecology

## 2021-08-10 ENCOUNTER — Inpatient Hospital Stay (HOSPITAL_COMMUNITY): Payer: 59 | Admitting: Anesthesiology

## 2021-08-10 ENCOUNTER — Other Ambulatory Visit: Payer: Self-pay

## 2021-08-10 DIAGNOSIS — O99820 Streptococcus B carrier state complicating pregnancy: Secondary | ICD-10-CM | POA: Diagnosis not present

## 2021-08-10 DIAGNOSIS — Z3A39 39 weeks gestation of pregnancy: Secondary | ICD-10-CM | POA: Diagnosis not present

## 2021-08-10 DIAGNOSIS — O26893 Other specified pregnancy related conditions, third trimester: Secondary | ICD-10-CM | POA: Diagnosis present

## 2021-08-10 DIAGNOSIS — O99824 Streptococcus B carrier state complicating childbirth: Secondary | ICD-10-CM | POA: Diagnosis not present

## 2021-08-10 DIAGNOSIS — O4202 Full-term premature rupture of membranes, onset of labor within 24 hours of rupture: Secondary | ICD-10-CM | POA: Diagnosis not present

## 2021-08-10 DIAGNOSIS — O480 Post-term pregnancy: Secondary | ICD-10-CM | POA: Diagnosis not present

## 2021-08-10 DIAGNOSIS — O326XX0 Maternal care for compound presentation, not applicable or unspecified: Secondary | ICD-10-CM | POA: Diagnosis not present

## 2021-08-10 LAB — COMPREHENSIVE METABOLIC PANEL
ALT: 22 U/L (ref 0–44)
AST: 25 U/L (ref 15–41)
Albumin: 3 g/dL — ABNORMAL LOW (ref 3.5–5.0)
Alkaline Phosphatase: 131 U/L — ABNORMAL HIGH (ref 38–126)
Anion gap: 8 (ref 5–15)
BUN: 10 mg/dL (ref 6–20)
CO2: 19 mmol/L — ABNORMAL LOW (ref 22–32)
Calcium: 9 mg/dL (ref 8.9–10.3)
Chloride: 105 mmol/L (ref 98–111)
Creatinine, Ser: 0.68 mg/dL (ref 0.44–1.00)
GFR, Estimated: >60 mL/min (ref >60)
Glucose, Bld: 77 mg/dL (ref 70–99)
Potassium: 3.5 mmol/L (ref 3.5–5.1)
Sodium: 132 mmol/L — ABNORMAL LOW (ref 135–145)
Total Bilirubin: 1 mg/dL (ref 0.3–1.2)
Total Protein: 7 g/dL (ref 6.5–8.1)

## 2021-08-10 LAB — TYPE AND SCREEN
ABO/RH(D): B POS
Antibody Screen: NEGATIVE

## 2021-08-10 LAB — PROTEIN / CREATININE RATIO, URINE
Creatinine, Urine: 116.75 mg/dL
Protein Creatinine Ratio: 0.11 mg/mg{Cre} (ref 0.00–0.15)
Total Protein, Urine: 13 mg/dL

## 2021-08-10 LAB — CBC
HCT: 38.9 % (ref 36.0–46.0)
Hemoglobin: 13.4 g/dL (ref 12.0–15.0)
MCH: 30.1 pg (ref 26.0–34.0)
MCHC: 34.4 g/dL (ref 30.0–36.0)
MCV: 87.4 fL (ref 80.0–100.0)
Platelets: 269 10*3/uL (ref 150–400)
RBC: 4.45 MIL/uL (ref 3.87–5.11)
RDW: 12.6 % (ref 11.5–15.5)
WBC: 10.3 10*3/uL (ref 4.0–10.5)
nRBC: 0 % (ref 0.0–0.2)

## 2021-08-10 LAB — RPR: RPR Ser Ql: NONREACTIVE

## 2021-08-10 MED ORDER — EPHEDRINE 5 MG/ML INJ: 10.0000 mg | INTRAVENOUS | Status: DC | PRN

## 2021-08-10 MED ORDER — WITCH HAZEL-GLYCERIN EX PADS: 1.0000 "application " | MEDICATED_PAD | CUTANEOUS | Status: DC | PRN

## 2021-08-10 MED ORDER — TETANUS-DIPHTH-ACELL PERTUSSIS 5-2.5-18.5 LF-MCG/0.5 IM SUSY: 0.5000 mL | PREFILLED_SYRINGE | Freq: Once | INTRAMUSCULAR | Status: DC

## 2021-08-10 MED ORDER — COCONUT OIL OIL: 1.0000 "application " | TOPICAL_OIL | Status: DC | PRN

## 2021-08-10 MED ORDER — BENZOCAINE-MENTHOL 20-0.5 % EX AERO
1.0000 "application " | INHALATION_SPRAY | CUTANEOUS | Status: DC | PRN
  Administered 2021-08-10: 1 via TOPICAL
  Filled 2021-08-10: qty 56

## 2021-08-10 MED ORDER — DIPHENHYDRAMINE HCL 25 MG PO CAPS: 25.0000 mg | ORAL_CAPSULE | Freq: Four times a day (QID) | ORAL | Status: DC | PRN

## 2021-08-10 MED ORDER — ONDANSETRON HCL 4 MG/2ML IJ SOLN: 4.0000 mg | Freq: Four times a day (QID) | INTRAMUSCULAR | Status: DC | PRN

## 2021-08-10 MED ORDER — TERBUTALINE SULFATE 1 MG/ML IJ SOLN: 0.2500 mg | Freq: Once | INTRAMUSCULAR | Status: DC | PRN

## 2021-08-10 MED ORDER — LACTATED RINGERS IV SOLN: 500.0000 mL | INTRAVENOUS | Status: DC | PRN

## 2021-08-10 MED ORDER — ONDANSETRON HCL 4 MG PO TABS: 4.0000 mg | ORAL_TABLET | ORAL | Status: DC | PRN

## 2021-08-10 MED ORDER — SODIUM CHLORIDE 0.9 % IV SOLN
5.0000 10*6.[IU] | Freq: Once | INTRAVENOUS | Status: AC
  Administered 2021-08-10: 5 10*6.[IU] via INTRAVENOUS
  Filled 2021-08-10: qty 5

## 2021-08-10 MED ORDER — DIPHENHYDRAMINE HCL 50 MG/ML IJ SOLN
12.5000 mg | INTRAMUSCULAR | Status: DC | PRN
Start: 2021-08-10 — End: 2021-08-10

## 2021-08-10 MED ORDER — OXYTOCIN BOLUS FROM INFUSION
333.0000 mL | Freq: Once | INTRAVENOUS | Status: AC
  Administered 2021-08-10: 333 mL via INTRAVENOUS

## 2021-08-10 MED ORDER — SOD CITRATE-CITRIC ACID 500-334 MG/5ML PO SOLN: 30.0000 mL | ORAL | Status: DC | PRN

## 2021-08-10 MED ORDER — FENTANYL CITRATE (PF) 100 MCG/2ML IJ SOLN
50.0000 ug | INTRAMUSCULAR | Status: DC | PRN
  Administered 2021-08-10: 50 ug via INTRAVENOUS
  Filled 2021-08-10: qty 2

## 2021-08-10 MED ORDER — PENICILLIN G POT IN DEXTROSE 60000 UNIT/ML IV SOLN
3.0000 10*6.[IU] | INTRAVENOUS | Status: DC
  Administered 2021-08-10: 3 10*6.[IU] via INTRAVENOUS
  Filled 2021-08-10: qty 50

## 2021-08-10 MED ORDER — ACETAMINOPHEN 325 MG PO TABS: 650.0000 mg | ORAL_TABLET | ORAL | Status: DC | PRN

## 2021-08-10 MED ORDER — SENNOSIDES-DOCUSATE SODIUM 8.6-50 MG PO TABS
2.0000 | ORAL_TABLET | Freq: Every day | ORAL | Status: DC
  Administered 2021-08-11: 2 via ORAL
  Filled 2021-08-10: qty 2

## 2021-08-10 MED ORDER — MEDROXYPROGESTERONE ACETATE 150 MG/ML IM SUSP: 150.0000 mg | INTRAMUSCULAR | Status: DC | PRN

## 2021-08-10 MED ORDER — LIDOCAINE HCL (PF) 1 % IJ SOLN: 30.0000 mL | INTRAMUSCULAR | Status: DC | PRN

## 2021-08-10 MED ORDER — PHENYLEPHRINE 40 MCG/ML (10ML) SYRINGE FOR IV PUSH (FOR BLOOD PRESSURE SUPPORT)
80.0000 ug | PREFILLED_SYRINGE | INTRAVENOUS | Status: DC | PRN
  Filled 2021-08-10: qty 10

## 2021-08-10 MED ORDER — IBUPROFEN 600 MG PO TABS
600.0000 mg | ORAL_TABLET | Freq: Four times a day (QID) | ORAL | Status: DC
  Administered 2021-08-10 – 2021-08-12 (×8): 600 mg via ORAL
  Filled 2021-08-10 (×8): qty 1

## 2021-08-10 MED ORDER — LACTATED RINGERS IV SOLN: INTRAVENOUS | Status: DC

## 2021-08-10 MED ORDER — OXYTOCIN-SODIUM CHLORIDE 30-0.9 UT/500ML-% IV SOLN
2.5000 [IU]/h | INTRAVENOUS | Status: DC
  Administered 2021-08-10: 2.5 [IU]/h via INTRAVENOUS
  Filled 2021-08-10: qty 500

## 2021-08-10 MED ORDER — LACTATED RINGERS IV SOLN: 500.0000 mL | Freq: Once | INTRAVENOUS | Status: DC

## 2021-08-10 MED ORDER — MISOPROSTOL 25 MCG QUARTER TABLET: 25.0000 ug | ORAL_TABLET | ORAL | Status: DC | PRN

## 2021-08-10 MED ORDER — DIBUCAINE (PERIANAL) 1 % EX OINT: 1.0000 "application " | TOPICAL_OINTMENT | CUTANEOUS | Status: DC | PRN

## 2021-08-10 MED ORDER — AZITHROMYCIN 500 MG PO TABS
1000.0000 mg | ORAL_TABLET | Freq: Once | ORAL | Status: AC
  Administered 2021-08-10: 1000 mg via ORAL
  Filled 2021-08-10 (×2): qty 2

## 2021-08-10 MED ORDER — FENTANYL-BUPIVACAINE-NACL 0.5-0.125-0.9 MG/250ML-% EP SOLN
12.0000 mL/h | EPIDURAL | Status: DC | PRN
  Administered 2021-08-10: 12 mL/h via EPIDURAL
  Filled 2021-08-10: qty 250

## 2021-08-10 MED ORDER — PRENATAL MULTIVITAMIN CH
1.0000 | ORAL_TABLET | Freq: Every day | ORAL | Status: DC
  Administered 2021-08-10 – 2021-08-11 (×2): 1 via ORAL
  Filled 2021-08-10 (×2): qty 1

## 2021-08-10 MED ORDER — SIMETHICONE 80 MG PO CHEW: 80.0000 mg | CHEWABLE_TABLET | ORAL | Status: DC | PRN

## 2021-08-10 MED ORDER — OXYCODONE-ACETAMINOPHEN 5-325 MG PO TABS: 1.0000 | ORAL_TABLET | ORAL | Status: DC | PRN

## 2021-08-10 MED ORDER — PHENYLEPHRINE 40 MCG/ML (10ML) SYRINGE FOR IV PUSH (FOR BLOOD PRESSURE SUPPORT)
80.0000 ug | PREFILLED_SYRINGE | INTRAVENOUS | Status: DC | PRN
Start: 2021-08-10 — End: 2021-08-10

## 2021-08-10 MED ORDER — OXYTOCIN-SODIUM CHLORIDE 30-0.9 UT/500ML-% IV SOLN: 1.0000 m[IU]/min | INTRAVENOUS | Status: DC

## 2021-08-10 MED ORDER — MEASLES, MUMPS & RUBELLA VAC IJ SOLR: 0.5000 mL | Freq: Once | INTRAMUSCULAR | Status: DC

## 2021-08-10 MED ORDER — ONDANSETRON HCL 4 MG/2ML IJ SOLN: 4.0000 mg | INTRAMUSCULAR | Status: DC | PRN

## 2021-08-10 MED ORDER — LIDOCAINE-EPINEPHRINE (PF) 2 %-1:200000 IJ SOLN
INTRAMUSCULAR | Status: DC | PRN
  Administered 2021-08-10: 5 mL via EPIDURAL

## 2021-08-10 NOTE — Lactation Note (Signed)
This note was copied from a baby's chart. Lactation Consultation Note  Patient Name: Boy Genevia Bouldin [id number] Date: 08/10/2021 Reason for consult: L&D Initial assessment Age:19 hours  P1, Baby cueing upon entering. Assisted with latching eager baby.  Had mother support her breast. Will follow up on MBU  Maternal Data Does the patient have breastfeeding experience prior to this delivery?: No  Feeding Mother's Current Feeding Choice: Breast Milk  LATCH Score Latch: Grasps breast easily, tongue down, lips flanged, rhythmical sucking.  Audible Swallowing: A few with stimulation  Type of Nipple: Everted at rest and after stimulation  Comfort (Breast/Nipple): Soft / non-tender  Hold (Positioning): Assistance needed to correctly position infant at breast and maintain latch.  LATCH Score: 8   Lactation Tools Discussed/Used    Interventions Interventions: Breast feeding basics reviewed;Assisted with latch;Skin to skin;Education  Discharge    Consult Status Consult Status: Follow-up from L&D    Dahlia Byes [facility] 08/10/2021, 8:22 AM

## 2021-08-10 NOTE — Plan of Care (Signed)
Pt demonstrated understanding 

## 2021-08-10 NOTE — MAU Note (Signed)
Pt c/o contractions now 5 minutes apart. No bleeding or LOF, +FM

## 2021-08-10 NOTE — Anesthesia Preprocedure Evaluation (Signed)
Anesthesia Evaluation  Patient identified by MRN, date of birth, ID band Patient awake    Reviewed: Allergy & Precautions, NPO status , Patient's Chart, lab work & pertinent test results  Airway Mallampati: II  TM Distance: >3 FB Neck ROM: Full    Dental no notable dental hx.    Pulmonary neg pulmonary ROS,    Pulmonary exam normal breath sounds clear to auscultation       Cardiovascular negative cardio ROS Normal cardiovascular exam Rhythm:Regular Rate:Normal     Neuro/Psych negative neurological ROS  negative psych ROS   GI/Hepatic negative GI ROS, Neg liver ROS,   Endo/Other  negative endocrine ROS  Renal/GU negative Renal ROS  negative genitourinary   Musculoskeletal negative musculoskeletal ROS (+)   Abdominal   Peds  Hematology negative hematology ROS (+)   Anesthesia Other Findings Presented with ROM  Reproductive/Obstetrics (+) Pregnancy                             Anesthesia Physical Anesthesia Plan  ASA: 2  Anesthesia Plan: Epidural   Post-op Pain Management:    Induction:   PONV Risk Score and Plan: Treatment may vary due to age or medical condition  Airway Management Planned: Natural Airway  Additional Equipment:   Intra-op Plan:   Post-operative Plan:   Informed Consent: I have reviewed the patients History and Physical, chart, labs and discussed the procedure including the risks, benefits and alternatives for the proposed anesthesia with the patient or authorized representative who has indicated his/her understanding and acceptance.       Plan Discussed with: Anesthesiologist  Anesthesia Plan Comments: (Patient identified. Risks, benefits, options discussed with patient including but not limited to bleeding, infection, nerve damage, paralysis, failed block, incomplete pain control, headache, blood pressure changes, nausea, vomiting, reactions to  medication, itching, and post partum back pain. Confirmed with bedside nurse the patient's most recent platelet count. Confirmed with the patient that they are not taking any anticoagulation, have any bleeding history or any family history of bleeding disorders. Patient expressed understanding and wishes to proceed. All questions were answered. )        Anesthesia Quick Evaluation

## 2021-08-10 NOTE — H&P (Addendum)
OBSTETRIC ADMISSION HISTORY AND PHYSICAL  Becky Crosby is a 20 y.o. female G2P0010 with IUP at 23w6dby 139 week[place]ore[place]resenting for ROM. She reports +FMs, +LOF, no VB. She plans on breast and bottle feeding. She is undecided but wants some type of birth control. She [facility] [place]  Estimated Date of Delivery: 08/11/21  Sono:    @[redacted]w[redacted]d , CWD, normal anatomy, Cephalic presentation, Anterior lie, 1229g, 28% EFW   Prenatal History/Complications: Chlamydia in 3rd trimester (received treatment with Azithromycin)  Past Medical History: Past Medical History:  Diagnosis Date   Constipation    Medical history non-contributory     Past Surgical History: Past Surgical History:  Procedure Laterality Date   NO PAST SURGERIES      Obstetrical History: OB History     Gravida  2   Para      Term      Preterm      AB  1   Living  0      SAB      IAB      Ectopic      Multiple      Live Births              Social History Social History   Socioeconomic History   Marital status: Single    Spouse name: Not on file   Number of children: Not on file   Years of education: Not on file   Highest education level: Not on file  Occupational History   Not on file  Tobacco Use   Smoking status: Never   Smokeless tobacco: Never  Vaping Use   Vaping Use: Never used  Substance and Sexual Activity   Alcohol use: Not Currently   Drug use: Not Currently   Sexual activity: Yes  Other Topics Concern   Not on file  Social History Narrative   Not on file   Social Determinants of Health   Financial Resource Strain: Not on file  Food Insecurity: Not on file  Transportation Needs: Not on file  Physical Activity: Not on file  Stress: Not on file  Social Connections: Not on file    Family History: Family History  Problem Relation Age of Onset   Hypertension Mother    Hypertension Maternal Aunt    Cancer Maternal Aunt     Hypertension Maternal Grandmother     Allergies: No Known Allergies  Medications Prior to Admission  Medication Sig Dispense Refill Last Dose   Prenat w/o A-FeCbn-DSS-FA-DHA (PRENAISSANCE PLUS) 28-1-250 MG CAPS TAKE 1 SOFTGEL BY MOUTH DAILY 30 capsule 13 Past Week   Blood Pressure Monitoring (BLOOD PRESSURE KIT) DEVI 1 kit by Does not apply route once a week. Check Blood Pressure regularly and record readings into the Babyscripts App.  Large Cuff.  DX O90.0 (Patient taking differently: 1 kit by Does not apply route once a week. Check Blood Pressure regularly and record readings into the Babyscripts App.  Large Cuff.  DX O90.0) 1 each 0      Review of Systems   All systems reviewed and negative except as stated in HPI  Blood pressure 130/73, pulse (!) 52, temperature 97.8 F (36.6 C), temperature source Oral, resp. rate 17, height 5' 8"  (1.727 m), weight 110.7 kg, last menstrual period 10/31/2020, SpO2 100 %, unknown if currently breastfeeding. General appearance: alert, cooperative, and mild distress Lungs: breathing comfortably Heart: regular rate Extremities:no  sign of DVT Presentation: cephalic Fetal monitoringBaseline: 150 bpm, Variability: Good {> 6 bpm), Accelerations: Reactive, and Decelerations: Absent Uterine activityIntensity: moderate Dilation: 5.5 Effacement (%): 80 Station: -2 Exam by:: [employer] RN   Prenatal labs: ABO, Rh: --/--/B POS (08/20 0200) Antibody: NEG (08/20 0200),  Anti-Lewis antibody Rubella: 7.56 (03/15 1049) RPR: Non Reactive (06/09 1136)  HBsAg: Negative (03/15 1049)  HIV: Non Reactive (06/09 1136)  GBS: Positive/-- (08/09 1130)  2 hr Glucola Normal Genetic screening  Normal Anatomy US Normal  Prenatal Transfer Tool  Maternal Diabetes: No Genetic Screening: Normal Maternal Ultrasounds/Referrals: Normal Fetal Ultrasounds or other Referrals:  None Maternal Substance Abuse:  No Significant Maternal Medications:  None Significant  Maternal Lab Results: Group B Strep positive  Results for orders placed or performed during the hospital encounter of 08/09/21 (from the past 24 hour(s))  CBC   Collection Time: 08/10/21  2:00 AM  Result Value Ref Range   WBC 10.3 4.0 - 10.5 K/uL   RBC 4.45 3.87 - 5.11 MIL/uL   Hemoglobin 13.4 12.0 - 15.0 g/dL   HCT 38.9 36.0 - 46.0 %   MCV 87.4 80.0 - 100.0 fL   MCH 30.1 26.0 - 34.0 pg   MCHC 34.4 30.0 - 36.0 g/dL   RDW 12.6 11.5 - 15.5 %   Platelets 269 150 - 400 K/uL   nRBC 0.0 0.0 - 0.2 %  Type and screen   Collection Time: 08/10/21  2:00 AM  Result Value Ref Range   ABO/RH(D) B POS    Antibody Screen NEG    Sample Expiration      08/13/2021,2359 Performed at [facility], [place] [address] [place], [place] [address]     Patient Active Problem List   Diagnosis Date Noted   Post term pregnancy over 40 weeks 08/10/2021   [redacted] weeks gestation of pregnancy 08/07/2021   Positive GBS test 08/02/2021   Chlamydia 07/31/2021   Intrauterine pregnancy in teenager 03/05/2021   Supervision of normal pregnancy 02/25/2021   Constipation     Assessment/Plan:  Becky Crosby is a 20 y.o. G2P0010 at 14w6dhere for SOL and ROM.  #Labor:  Making cervical change.  Did have meconium with ROM.  Will hold off on starting Pit until next check.  #Pain: Epidural PRN #FWB: Cat 1 #ID: GBS+, will treat with penicillin #MOF: Both #MOC:  Undecided #Circ: Yes  PDelora Fuel MD  08/10/2021, 3:23 AM   Attestation of Supervision of Student:  I confirm that I have verified the information documented in the  resident 's note and that I have also personally reperformed the history, physical exam and all medical decision making activities.  I have verified that all services and findings are accurately documented in this student's note; and I agree with management and plan as outlined in the documentation. I have also made any necessary editorial changes.  Patient was treated for (+) CT on  07/31/2021; TOC not done at this time. Plan to defer to PP visit.  RLaury Deep CDes Moinesfor WDe[employer]un [employer]ates[employer] 08/10/2021 5:25 AM

## 2021-08-10 NOTE — MAU Note (Signed)
Pt up to wheelchair for transfer to LD amniotic fluid now looks light green tinged. Pts LD RN and Dr.Maness updated with change

## 2021-08-10 NOTE — Anesthesia Procedure Notes (Signed)
Epidural Patient location during procedure: OB Start time: 08/10/2021 4:10 AM End time: 08/10/2021 4:20 AM  Staffing Anesthesiologist: Elmer Picker, MD Performed: anesthesiologist   Preanesthetic Checklist Completed: patient identified, IV checked, risks and benefits discussed, monitors and equipment checked, pre-op evaluation and timeout performed  Epidural Patient position: sitting Prep: DuraPrep and site prepped and draped Patient monitoring: continuous pulse ox, blood pressure, heart rate and cardiac monitor Approach: midline Location: L3-L4 Injection technique: LOR air  Needle:  Needle type: Tuohy  Needle gauge: 17 G Needle length: 9 cm Needle insertion depth: 7 cm Catheter type: closed end flexible Catheter size: 19 Gauge Catheter at skin depth: 12 cm Test dose: negative  Assessment Sensory level: T8 Events: blood not aspirated, injection not painful, no injection resistance, no paresthesia and negative IV test  Additional Notes Patient identified. Risks/Benefits/Options discussed with patient including but not limited to bleeding, infection, nerve damage, paralysis, failed block, incomplete pain control, headache, blood pressure changes, nausea, vomiting, reactions to medication both or allergic, itching and postpartum back pain. Confirmed with bedside nurse the patient's most recent platelet count. Confirmed with patient that they are not currently taking any anticoagulation, have any bleeding history or any family history of bleeding disorders. Patient expressed understanding and wished to proceed. All questions were answered. Sterile technique was used throughout the entire procedure. Please see nursing notes for vital signs. Test dose was given through epidural catheter and negative prior to continuing to dose epidural or start infusion. Warning signs of high block given to the patient including shortness of breath, tingling/numbness in hands, complete motor block,  or any concerning symptoms with instructions to call for help. Patient was given instructions on fall risk and not to get out of bed. All questions and concerns addressed with instructions to call with any issues or inadequate analgesia.  Reason for block:procedure for pain

## 2021-08-10 NOTE — Lactation Note (Addendum)
This note was copied from a baby's chart. Lactation Consultation Note  Patient Name: Becky Crosby [id number] Date: 08/10/2021 Reason for consult: Initial assessment;Mother's request;Difficult latch;Primapara;1st time breastfeeding;Term Age: 19 hrs Infant not able to latch arching back and holding tongue back.   Plan 1. To feed based on cues 8-12x in 24 hr period. Mom to keep infant STS and offer breast, when cued get a deep latch with breast compression and listen for swallows.  2. Mom to offer EBM via spoon if unable to latch.  3. Mom wants to set and use her DEBP, pump q 3 hrs for 15 min to increase let down.  4. I and O sheet reviewed.  5 LC brochure of inpatient and outpatient services provided.   All questions answered at the end of the visit.  LC went back to check with Mom after using her pump. Mom stated increase in flow and infant latched for 20 min.  Mom now pre pump with personal pump 5-10 min before latching. Maternal Data Has patient been taught Hand Expression?: Yes  Feeding Mother's Current Feeding Choice: Breast Milk  LATCH Score                    Lactation Tools Discussed/Used Tools: Pump;Flanges Flange Size: 24 Breast pump type: Other (comment) (Mom asked for assistance setting up her personal DEBP medela pump and style) Pump Education: Setup, frequency, and cleaning;Milk Storage Reason for Pumping: increase stimulation Pumping frequency: every 3 hrs for 15 min  Interventions Interventions: Breast feeding basics reviewed;Breast compression;Assisted with latch;Adjust position;Skin to skin;Support pillows;Breast massage;Position options;Hand express;Expressed milk;Education  Discharge Pump: Personal  Consult Status Consult Status: Follow-up Date: 08/11/21 Follow-up type: In-patient    Orvel Cutsforth  Nicholson-Springer 08/10/2021, 5:49 PM

## 2021-08-10 NOTE — Progress Notes (Signed)
OB/GYN Faculty Practice Delivery Note  Becky Crosby is a 20 y.o. G2P0010 s/p SVD at [redacted]w[redacted]d. She was admitted for SOL and SROM.   ROM: 6h 19m with meconium stained fluid GBS Status: Positive Maximum Maternal Temperature: 98.4  Labor Progress: No augmentation  Delivery Date/Time: 08/10/21 at 0732 Delivery: Called to room and patient was complete and pushing. Head delivered ROA, compound left arm. No nuchal cord present, loose body cord. Shoulder and body delivered in usual fashion. Infant with spontaneous cry, placed on mother's abdomen, dried and stimulated. Cord clamped x 2 after 1-minute delay, and cut by fob under my direct supervision. Cord blood drawn. Placenta delivered spontaneously with gentle cord traction. Fundus firm with massage and Pitocin. Labia, perineum, vagina, and cervix were inspected, 2nd degree posterior vaginal tear.   Placenta: 3 vessel cord, Intact, sent to L&D Complications: Compound left arm, loose body cord Lacerations: 2nd degree posterior vaginal tear EBL: 224 mL Analgesia: Epidural  Postpartum Planning [ ]  message to sent to schedule follow-up  [ ]  vaccines UTD  Infant: Healthy baby boy  APGARs 9,9  Weight pending  , MD

## 2021-08-11 NOTE — Lactation Note (Signed)
This note was copied from a baby's chart. Lactation Consultation Note  Patient Name: Becky Crosby [id number] Date: 08/11/2021   Age:19 hours   LC Note:  Attempted to visit with mother, however, she was asleep.  Baby swaddled and in the bassinet.  Support person present.   Maternal Data    Feeding    LATCH Score                    Lactation Tools Discussed/Used    Interventions    Discharge    Consult Status      [name] R Sherine Cortese 08/11/2021, 4:04 PM

## 2021-08-11 NOTE — Lactation Note (Signed)
This note was copied from a baby's chart. Lactation Consultation Note  Patient Name: Boy Norlene Lanes [id number] Date: 08/11/2021 Reason for consult: Follow-up assessment Age:19 hours   LC Note:  Second attempt to visit with family, however, mother was in the shower.  Spoke with father to get an update on mother's breast feeding .  Father reported that she seems to be breast feeding quite well.  Baby last fed approximately one hour ago.  Asked father to have mother call for lactation services as needed and/or to assist with latching.  Father verbalized understanding.   Maternal Data    Feeding    LATCH Score                    Lactation Tools Discussed/Used    Interventions    Discharge    Consult Status Consult Status: Follow-up Date: 08/11/21 Follow-up type: In-patient    Dora Sims 08/11/2021, 6:16 PM

## 2021-08-11 NOTE — Anesthesia Postprocedure Evaluation (Signed)
Anesthesia Post Note  Patient: Becky Crosby  Procedure(s) Performed: AN AD HOC LABOR EPIDURAL     Patient location during evaluation: Mother Baby Anesthesia Type: Epidural Level of consciousness: awake and alert and oriented Pain management: satisfactory to patient Vital Signs Assessment: post-procedure vital signs reviewed and stable Respiratory status: respiratory function stable Cardiovascular status: stable Postop Assessment: no headache, no backache, epidural receding, patient able to bend at knees, no signs of nausea or vomiting and adequate PO intake Anesthetic complications: no Comments: The patient reported that her left leg was more numb the her right during labor but she was comfortable. After delivery, her right leg recovered quickly but her left leg has been lagging behind. She said it is slowly improving and was able to walk to the bathroom using the assist device.   No notable events documented.  Last Vitals:  Vitals:   08/10/21 2009 08/11/21 0522  BP: (!) 109/53 (!) 112/58  Pulse: (!) 57 (!) 53  Resp: 20 18  Temp: (!) 36.4 C 36.8 C  SpO2: 100% 100%    Last Pain:  Vitals:   08/11/21 0522  TempSrc: Oral  PainSc: 0-No pain   Pain Goal:                   Coltrane Tugwell

## 2021-08-11 NOTE — Progress Notes (Signed)
POSTPARTUM PROGRESS NOTE  Post Partum Day 1  Subjective:  Becky Crosby is a 20 y.o. G2P1011 s/p VD at [redacted]w[redacted]d.  She reports she is doing well. No acute events overnight. She denies any problems with ambulating, voiding or po intake. Denies nausea or vomiting.  Pain is well controlled.  Lochia is mild.  Objective: Blood pressure (!) 112/58, pulse (!) 53, temperature 98.2 F (36.8 C), temperature source Oral, resp. rate 18, height 5\' 8"  (1.727 m), weight 110.7 kg, last menstrual period 10/31/2020, SpO2 100 %, unknown if currently breastfeeding.  Physical Exam:  General: alert, cooperative and no distress Chest: no respiratory distress Heart:regular rate, distal pulses intact Abdomen: soft, nontender,  Uterine Fundus: firm, appropriately tender DVT Evaluation: No calf swelling or tenderness Extremities: minimal edema Skin: warm, dry  Recent Labs    08/10/21 0200  HGB 13.4  HCT 38.9    Assessment/Plan: Becky Crosby is a 20 y.o. G2P1011 s/p VD at [redacted]w[redacted]d   PPD#1 - Doing well  Routine postpartum care  Contraception: Would like to do nexplanon at postpartum visit  Feeding: both  Consented mother for circumcision, note placed in infants chart, likely will occur tomorrow.   Dispo: Plan for discharge tomorrow.   LOS: 1 day   [redacted]w[redacted]d, DO  OB Fellow  08/11/2021, 1:02 PM

## 2021-08-12 ENCOUNTER — Other Ambulatory Visit (HOSPITAL_COMMUNITY): Payer: Self-pay

## 2021-08-12 MED ORDER — SIMETHICONE 80 MG PO CHEW: 80.0000 mg | CHEWABLE_TABLET | ORAL | 0 refills | Status: DC | PRN

## 2021-08-12 MED ORDER — COCONUT OIL OIL: 1.0000 "application " | TOPICAL_OIL | 0 refills | Status: DC | PRN

## 2021-08-12 MED ORDER — IBUPROFEN 600 MG PO TABS
600.0000 mg | ORAL_TABLET | Freq: Four times a day (QID) | ORAL | 0 refills | Status: DC
  Filled 2021-08-12: qty 30, 8d supply, fill #0

## 2021-08-12 MED ORDER — ACETAMINOPHEN 325 MG PO TABS: 650.0000 mg | ORAL_TABLET | ORAL | Status: DC | PRN

## 2021-08-12 NOTE — Lactation Note (Signed)
This note was copied from a baby's chart. Lactation Consultation Note  Patient Name: Becky Crosby [id number] Date: 08/12/2021 Reason for consult: Follow-up assessment;Term;Primapara;1st time breastfeeding Age:19 hours   P1 mother whose infant is now 61 hours old.  This is a term baby at 39+6 weeks with a 9% weight loss since birth.  Baby was asleep in mother's arms when I arrived.  She has been breast feeding frequently and had no questions/concerns related to breast feeding.  Mother feels like baby has been latching well.  Last LATCH score was a 9; multiple voids and stools.  The last stool was yesterday at 0800. She reports that her breasts are feeling fuller this morning.    Mother will continue to feed on cue or at least 8-12 times/24 hours.  Encouraged frequent feedings due to weight loss.  Mother verbalized understanding.  She plans to begin pumping at home after discharge and desires to breast feed but to also pump and bottle feed.  Questions answered regarding pumping.  Father present and asleep on the couch.  Mother has a DEBP for home use.   Maternal Data Has patient been taught Hand Expression?: Yes Does the patient have breastfeeding experience prior to this delivery?: No  Feeding Mother's Current Feeding Choice: Breast Milk  LATCH Score Latch: Grasps breast easily, tongue down, lips flanged, rhythmical sucking.  Audible Swallowing: A few with stimulation  Type of Nipple: Everted at rest and after stimulation  Comfort (Breast/Nipple): Soft / non-tender  Hold (Positioning): No assistance needed to correctly position infant at breast.  LATCH Score: 9   Lactation Tools Discussed/Used Breast pump type: Manual Pump Education: Setup, frequency, and cleaning Pumping frequency: PRN  Interventions Interventions: Education  Discharge Discharge Education: Engorgement and breast care Pump: Manual;Personal  Consult Status Consult Status: Complete Date:  08/12/21 Follow-up type: Call as needed    Becky Crosby 08/12/2021, 7:25 AM

## 2021-08-12 NOTE — Discharge Summary (Signed)
Postpartum Discharge Summary     Patient Name: Ronia Hazelett DOB: 03-Aug-2001 MRN: [id number]  Date of admission: 08/09/2021 Delivery date:08/10/2021  Delivering provider: Delora Fuel  Date of discharge: 08/12/2021  Admitting diagnosis: Post term pregnancy over 40 weeks [O48.0] Intrauterine pregnancy: [redacted]w[redacted]d    Secondary diagnosis:  Active Problems:   Post term pregnancy over 40 weeks  Additional problems: Chlamydia in 3rd trimester (treated)   Discharge diagnosis: Term Pregnancy Delivered                                              Post partum procedures: None Augmentation: N/A Complications: None  Hospital course: Onset of Labor With Vaginal Delivery      20y.o. yo G2P1011 at 353w6das admitted in Active Labor on 08/09/2021. Patient had an uncomplicated labor course as follows:  Membrane Rupture Time/Date: 1:20 AM ,08/10/2021   Delivery Method:Vaginal, Spontaneous  Episiotomy: None  Lacerations:  2nd degree;Vaginal  Patient had an uncomplicated postpartum course.  She is ambulating, tolerating a regular diet, passing flatus, and urinating well. Patient is discharged home in stable condition on 08/12/21.  Newborn Data: Birth date:08/10/2021  Birth time:7:32 AM  Gender:Female  Living status:Living  Apgars:9 ,9  Weight:2995 g   Magnesium Sulfate received: No BMZ received: No Rhophylac:No MMR:N/A T-DaP:Given prenatally Flu: No Transfusion:No  Physical exam  Vitals:   08/11/21 0522 08/11/21 1714 08/11/21 2033 08/12/21 0600  BP: (!) 112/58 100/82 129/67 114/66  Pulse: (!) 53 70 62 (!) 59  Resp: 18 17 18 18   Temp: 98.2 F (36.8 C) (!) 97.4 F (36.3 C) 98 F (36.7 C) 97.6 F (36.4 C)  TempSrc: Oral Oral Oral Oral  SpO2: 100%  100% 100%  Weight:      Height:       General: alert, cooperative, and no distress Lochia: appropriate Uterine Fundus: firm Incision: N/A DVT Evaluation: No evidence of DVT seen on physical exam. Labs: Lab Results  Component Value Date    WBC 10.3 08/10/2021   HGB 13.4 08/10/2021   HCT 38.9 08/10/2021   MCV 87.4 08/10/2021   PLT 269 08/10/2021   CMP Latest Ref Rng & Units 08/10/2021  Glucose 70 - 99 mg/dL 77  BUN 6 - 20 mg/dL 10  Creatinine 0.44 - 1.00 mg/dL 0.68  Sodium 135 - 145 mmol/L 132(L)  Potassium 3.5 - 5.1 mmol/L 3.5  Chloride 98 - 111 mmol/L 105  CO2 22 - 32 mmol/L 19(L)  Calcium 8.9 - 10.3 mg/dL 9.0  Total Protein 6.5 - 8.1 g/dL 7.0  Total Bilirubin 0.3 - 1.2 mg/dL 1.0  Alkaline Phos 38 - 126 U/L 131(H)  AST 15 - 41 U/L 25  ALT 0 - 44 U/L 22   Edinburgh Score: Edinburgh Postnatal Depression Scale Screening Tool 08/11/2021  I have been able to laugh and see the funny side of things. 0  I have looked forward with enjoyment to things. 0  I have blamed myself unnecessarily when things went wrong. 0  I have been anxious or worried for no good reason. 0  I have felt scared or panicky for no good reason. 0  Things have been getting on top of me. 0  I have been so unhappy that I have had difficulty sleeping. 0  I have felt sad or miserable. 0  I have been so unhappy that I  have been crying. 0  The thought of harming myself has occurred to me. 0  Edinburgh Postnatal Depression Scale Total 0     After visit meds:  Allergies as of 08/12/2021   No Known Allergies      Medication List     STOP taking these medications    Blood Pressure Kit Devi       TAKE these medications    acetaminophen 325 MG tablet Commonly known as: Tylenol Take 2 tablets (650 mg total) by mouth every 4 (four) hours as needed (for pain scale < 4).   coconut oil Oil Apply 1 application topically as needed.   ibuprofen 600 MG tablet Commonly known as: ADVIL Take 1 tablet (600 mg total) by mouth every 6 (six) hours.   Prenaissance Plus 28-1-250 MG Caps TAKE 1 SOFTGEL BY MOUTH DAILY   simethicone 80 MG chewable tablet Commonly known as: MYLICON Chew 1 tablet (80 mg total) by mouth as needed for flatulence.          Discharge home in stable condition Infant Feeding: Breast Infant Disposition:rooming in Discharge instruction: per After Visit Summary and Postpartum booklet. Activity: Advance as tolerated. Pelvic rest for 6 weeks.  Diet: routine diet Future Appointments: Future Appointments  Date Time Provider [place]  08/15/2021 10:55 AM Griffin Basil, MD [facility] None   Follow up Visit:   Please schedule this patient for a In person postpartum visit in 6 weeks with the following provider: Any provider. Additional Postpartum F/U: None   Low risk pregnancy complicated by:  None Delivery mode:  Vaginal, Spontaneous  Anticipated Birth Control:  Plan for OP Nexplanon   08/12/2021 Delora Fuel, MD

## 2021-08-15 ENCOUNTER — Inpatient Hospital Stay (HOSPITAL_COMMUNITY): Admission: AD | Admit: 2021-08-15 | Payer: 59 | Source: Home / Self Care | Admitting: Obstetrics and Gynecology

## 2021-08-15 ENCOUNTER — Encounter: Payer: 59 | Admitting: Obstetrics and Gynecology

## 2021-08-15 ENCOUNTER — Inpatient Hospital Stay (HOSPITAL_COMMUNITY): Payer: 59

## 2021-08-20 ENCOUNTER — Other Ambulatory Visit (HOSPITAL_COMMUNITY): Payer: Self-pay

## 2021-08-20 ENCOUNTER — Telehealth (HOSPITAL_COMMUNITY): Payer: Self-pay

## 2021-08-20 NOTE — Telephone Encounter (Signed)
"  I'm doing fine. I'm still having some pain or soreness in my vaginal area. I can especially feel it when I stand up. I had stitches." Patient declines foul vaginal odor or drainage. RN reviewed normal healing process and peri care. RN told patient let her OB-GYN know if she has increased pain in her vaginal area or foul odor/drainage. Patient has no other questions or concerns about her healing.  "He is doing good. He got circumcised while we were in the hospital. Should I continue to put vaseline on the head of his penis with diaper changes?" RN told patient to continue the vaseline and to ask her pediatrician. "He has a doctor appointment coming up soon, his 1 month check up. At his last doctor appointment they said he was gaining weight well. He eats really frequently but my pediatrician said that is normal. He sleeps in a bassinet."  RN reviewed ABC's of safe sleep with patient. Patient declines any questions or concerns about baby.  EPDS score is 1.  Becky Crosby [facility] 08/20/2021,1813

## 2021-09-16 ENCOUNTER — Encounter: Payer: Self-pay | Admitting: Obstetrics and Gynecology

## 2021-09-16 ENCOUNTER — Ambulatory Visit (INDEPENDENT_AMBULATORY_CARE_PROVIDER_SITE_OTHER): Payer: 59 | Admitting: Obstetrics and Gynecology

## 2021-09-16 ENCOUNTER — Other Ambulatory Visit: Payer: Self-pay

## 2021-09-16 VITALS — BP 110/71 | HR 69 | Wt 237.0 lb

## 2021-09-16 DIAGNOSIS — Z30017 Encounter for initial prescription of implantable subdermal contraceptive: Secondary | ICD-10-CM

## 2021-09-16 LAB — POCT URINE PREGNANCY: Preg Test, Ur: NEGATIVE

## 2021-09-16 MED ORDER — ETONOGESTREL 68 MG ~~LOC~~ IMPL
68.0000 mg | DRUG_IMPLANT | Freq: Once | SUBCUTANEOUS | Status: AC
Start: 2021-09-16 — End: 2021-09-16
  Administered 2021-09-16: 68 mg via SUBCUTANEOUS

## 2021-09-16 NOTE — Progress Notes (Signed)
Administrations This Visit     etonogestrel (NEXPLANON) implant 68 mg     Admin Date 09/16/2021 Action Given Dose 68 mg Route Subdermal Administered By Maretta Bees, RMA

## 2021-09-16 NOTE — Progress Notes (Signed)
Nexplanon insertion  Patient given informed consent, signed copy in the chart, time out was performed. Pregnancy test was negative. Appropriate time out taken.  Patient's left arm was prepped and draped in the usual sterile fashion.. The ruler used to measure and mark insertion area.  Patient was prepped with alcohol swab and then injected with 3 cc of 1% lidocaine with epinephrine.  Patient was prepped with betadine, Nexplanon removed form packaging.  Device confirmed in needle, then inserted full length of needle and withdrawn per handbook instructions.  Patient insertion site covered with a band aid and co-band.   Minimal blood loss.  Patient tolerated the procedure well.

## 2021-09-16 NOTE — Progress Notes (Signed)
negf   Post Partum Visit Note  Becky Crosby is a 20 y.o. G38P1011 female who presents for a postpartum visit. She is 6 weeks postpartum following a normal spontaneous vaginal delivery.  I have fully reviewed the prenatal and intrapartum course. The delivery was at 39 gestational weeks.  Anesthesia: local. Postpartum course has been uncomplicated. Baby is doing well. Baby is feeding by bottle - [employer] Good Start. Bleeding staining only. Bowel function is normal. Bladder function is normal. Patient is not sexually active. Contraception method is nexplanon. Postpartum depression screening:   The pregnancy intention screening data noted above was reviewed. Potential methods of contraception were discussed. The patient elected to proceed with No data recorded.    Health Maintenance Due  Topic Date Due   COVID-19 Vaccine (1) Never done   HPV VACCINES (1 - 2-dose series) Never done   INFLUENZA VACCINE  Never done       Review of Systems Pertinent items noted in HPI and remainder of comprehensive ROS otherwise negative.  Objective:  BP 110/71   Pulse 69   Wt 237 lb (107.5 kg)   LMP 10/31/2020 (Exact Date)   BMI 36.04 kg/m    General:  alert, cooperative, and no distress   Breasts:  normal  Lungs: clear to auscultation bilaterally  Heart:  regular rate and rhythm  Abdomen: soft, non-tender; bowel sounds normal; no masses,  no organomegaly   Wound   GU exam:  normal       Assessment:    There are no diagnoses linked to this encounter.  Normal postpartum exam.   Plan:   Essential components of care per ACOG recommendations:  1.  Mood and well being: Patient with negative depression screening today. Reviewed local resources for support.  - Patient tobacco use? No.   - hx of drug use? No.    2. Infant care and feeding:  -Patient currently breastmilk feeding? No.  -Social determinants of health (SDOH) reviewed in EPIC. No concerns  3. Sexuality, contraception and birth  spacing - Patient does not want a pregnancy in the next year.   - Reviewed forms of contraception in tiered fashion. Patient desired  Nexplanon  today.   - Discussed birth spacing of 18 months  4. Sleep and fatigue -Encouraged family/partner/community support of 4 hrs of uninterrupted sleep to help with mood and fatigue  5. Physical Recovery  - Discussed patients delivery and complications. She describes her labor as good. - Patient had a Vaginal, no problems at delivery. Patient had a 2nd degree laceration. Perineal healing reviewed. Patient expressed understanding - Patient has urinary incontinence? No. - Patient is safe to resume physical and sexual activity  6.  Health Maintenance - HM due items addressed Yes - Pap smear not done at today's visit.  -Breast Cancer screening indicated? No.   7. Chronic Disease/Pregnancy Condition follow up: None  - PCP follow up  Catalina Antigua, MD [facility], [facility]

## 2021-09-16 NOTE — Addendum Note (Signed)
Addended by: Maretta Bees on: 09/16/2021 04:08 PM   Modules accepted: Orders

## 2021-09-21 DIAGNOSIS — Z419 Encounter for procedure for purposes other than remedying health state, unspecified: Secondary | ICD-10-CM | POA: Diagnosis not present

## 2021-10-22 DIAGNOSIS — Z419 Encounter for procedure for purposes other than remedying health state, unspecified: Secondary | ICD-10-CM | POA: Diagnosis not present

## 2021-11-21 DIAGNOSIS — Z419 Encounter for procedure for purposes other than remedying health state, unspecified: Secondary | ICD-10-CM | POA: Diagnosis not present

## 2021-12-22 DIAGNOSIS — Z419 Encounter for procedure for purposes other than remedying health state, unspecified: Secondary | ICD-10-CM | POA: Diagnosis not present

## 2022-01-19 ENCOUNTER — Encounter (HOSPITAL_COMMUNITY): Payer: Self-pay | Admitting: *Deleted

## 2022-01-19 ENCOUNTER — Ambulatory Visit (INDEPENDENT_AMBULATORY_CARE_PROVIDER_SITE_OTHER): Payer: 59

## 2022-01-19 ENCOUNTER — Other Ambulatory Visit: Payer: Self-pay

## 2022-01-19 ENCOUNTER — Ambulatory Visit (HOSPITAL_COMMUNITY)
Admission: EM | Admit: 2022-01-19 | Discharge: 2022-01-19 | Disposition: A | Discharge status: Home / Self Care | Payer: 59 | Attending: Physician Assistant | Admitting: Physician Assistant

## 2022-01-19 DIAGNOSIS — J40 Bronchitis, not specified as acute or chronic: Secondary | ICD-10-CM

## 2022-01-19 DIAGNOSIS — R051 Acute cough: Secondary | ICD-10-CM

## 2022-01-19 DIAGNOSIS — R059 Cough, unspecified: Secondary | ICD-10-CM | POA: Diagnosis not present

## 2022-01-19 DIAGNOSIS — J329 Chronic sinusitis, unspecified: Secondary | ICD-10-CM

## 2022-01-19 MED ORDER — AMOXICILLIN-POT CLAVULANATE 875-125 MG PO TABS: 1.0000 | ORAL_TABLET | Freq: Two times a day (BID) | ORAL | 0 refills | Status: DC

## 2022-01-19 MED ORDER — ALBUTEROL SULFATE HFA 108 (90 BASE) MCG/ACT IN AERS
INHALATION_SPRAY | RESPIRATORY_TRACT | Status: AC
  Filled 2022-01-19: qty 6.7

## 2022-01-19 MED ORDER — PREDNISONE 20 MG PO TABS
40.0000 mg | ORAL_TABLET | Freq: Once | ORAL | Status: AC
  Administered 2022-01-19: 40 mg via ORAL

## 2022-01-19 MED ORDER — METHYLPREDNISOLONE SODIUM SUCC 125 MG IJ SOLR
INTRAMUSCULAR | Status: AC
  Filled 2022-01-19: qty 2

## 2022-01-19 MED ORDER — PREDNISONE 20 MG PO TABS
ORAL_TABLET | ORAL | Status: AC
  Filled 2022-01-19: qty 2

## 2022-01-19 MED ORDER — PROMETHAZINE-DM 6.25-15 MG/5ML PO SYRP: 5.0000 mL | ORAL_SOLUTION | Freq: Three times a day (TID) | ORAL | 0 refills | Status: DC | PRN

## 2022-01-19 MED ORDER — PREDNISONE 20 MG PO TABS: 40.0000 mg | ORAL_TABLET | Freq: Every day | ORAL | 0 refills | Status: AC

## 2022-01-19 MED ORDER — METHYLPREDNISOLONE SODIUM SUCC 125 MG IJ SOLR
80.0000 mg | Freq: Once | INTRAMUSCULAR | Status: DC
Start: 2022-01-19 — End: 2022-01-19

## 2022-01-19 MED ORDER — ALBUTEROL SULFATE HFA 108 (90 BASE) MCG/ACT IN AERS
2.0000 | INHALATION_SPRAY | Freq: Once | RESPIRATORY_TRACT | Status: AC
  Administered 2022-01-19: 2 via RESPIRATORY_TRACT

## 2022-01-19 NOTE — ED Triage Notes (Signed)
Pt reports a cough for 2 weeks . Pt reports she can not sleep due to cough.

## 2022-01-19 NOTE — ED Provider Notes (Signed)
[facility]    CSN: [id number] Arrival date & time: 01/19/22  1520      History   Chief Complaint Chief Complaint  Patient presents with   Cough    HPI Becky Crosby is a 21 y.o. female.   Patient presents today with 2-week history of worsening severe cough.  Reports that initially she had URI symptoms but congestion and other symptoms have resolved.  She continues to have a severe cough that is worse when she lays down at night.  This is a dry cough and often gets into coughing spells that have caused sore throat.  She has tried Mucinex, TheraFlu, Claritin without improvement of symptoms.  Denies any recent antibiotic or steroid use.  She denies history of asthma or COPD.  She used to smoke black and milds but has not been smoking for the past several weeks and continues to have symptoms.  She is confident that she is not pregnant as she has a Nexplanon.  She is not breast-feeding.   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 Authorizing Provider  amoxicillin-clavulanate (AUGMENTIN) 875-125 MG tablet Take 1 tablet by mouth every 12 (twelve) hours. 01/19/22  Yes Jean Alejos K, PA-C  predniSONE (DELTASONE) 20 MG tablet Take 2 tablets (40 mg total) by mouth daily for 4 days. 01/19/22 01/23/22 Yes Asif Muchow K,  PA-C  promethazine-dextromethorphan (PROMETHAZINE-DM) 6.25-15 MG/5ML syrup Take 5 mLs by mouth 3 (three) times daily as needed for cough. 01/19/22  Yes Joette Schmoker, Noberto Retort, PA-C  acetaminophen (TYLENOL) 325 MG tablet Take 2 tablets (650 mg total) by mouth every 4 (four) hours as needed (for pain scale < 4). Patient not taking: Reported on 09/16/2021 08/12/21   Jovita Kussmaul, MD  coconut oil OIL Apply 1 application topically as needed. Patient not taking: Reported on 09/16/2021 08/12/21   Jovita Kussmaul, MD  ibuprofen (ADVIL) 600 MG tablet Take 1 tablet (600 mg total) by mouth every 6 (six) hours. Patient not taking: Reported on 09/16/2021 08/12/21   Jovita Kussmaul, MD  Prenat w/o A-FeCbn-DSS-FA-DHA (PRENAISSANCE PLUS) 28-1-250 MG CAPS TAKE 1 SOFTGEL BY MOUTH DAILY 03/05/21 03/05/22  Gita Kudo, MD  simethicone (MYLICON) 80 MG chewable tablet Chew 1 tablet (80 mg total) by mouth as needed for flatulence. Patient not taking: Reported on 09/16/2021 08/12/21   Jovita Kussmaul, MD    Family History Family History  Problem Relation Age of Onset   Hypertension Mother    Hypertension Maternal Grandmother  Hypertension Maternal Aunt    Cancer Maternal Aunt     Social History Social History   Tobacco Use   Smoking status: Never   Smokeless tobacco: Never  Vaping Use   Vaping Use: Never used  Substance Use Topics   Alcohol use: Not Currently   Drug use: Not Currently     Allergies   Patient has no known allergies.   Review of Systems Review of Systems  Constitutional:  Positive for activity change. Negative for appetite change, fatigue and fever.  HENT:  Positive for sore throat. Negative for congestion, sinus pressure and sneezing.   Respiratory:  Positive for cough, chest tightness and shortness of breath.   Cardiovascular:  Negative for chest pain.  Gastrointestinal:  Negative for abdominal pain, diarrhea, nausea and vomiting.  Neurological:  Negative for dizziness, light-headedness  and headaches.    Physical Exam Triage Vital Signs ED Triage Vitals  Enc Vitals Group     BP 01/19/22 1534 135/65     Pulse Rate 01/19/22 1534 100     Resp 01/19/22 1534 20     Temp 01/19/22 1534 98.9 F (37.2 C)     Temp src --      SpO2 01/19/22 1534 98 %     Weight --      Height --      Head Circumference --      Peak Flow --      Pain Score 01/19/22 1532 0     Pain Loc --      Pain Edu? --      Excl. in GC? --    No data found.  Updated Vital Signs BP 135/65    Pulse 100    Temp 98.9 F (37.2 C)    Resp 20    LMP 12/20/2021 (Approximate)    SpO2 98%   Visual Acuity Right Eye Distance:   Left Eye Distance:   Bilateral Distance:    Right Eye Near:   Left Eye Near:    Bilateral Near:     Physical Exam Vitals reviewed.  Constitutional:      General: She is awake. She is not in acute distress.    Appearance: Normal appearance. She is well-developed. She is not ill-appearing.     Comments: Very pleasant female appears stated age in no acute distress sitting comfortably in exam room  HENT:     Head: Normocephalic and atraumatic.     Right Ear: Tympanic membrane, ear canal and external ear normal. There is impacted cerumen.     Left Ear: Tympanic membrane, ear canal and external ear normal. Tympanic membrane is not erythematous or bulging.     Nose:     Right Sinus: No maxillary sinus tenderness or frontal sinus tenderness.     Left Sinus: No maxillary sinus tenderness or frontal sinus tenderness.     Mouth/Throat:     Pharynx: Uvula midline. Posterior oropharyngeal erythema present. No oropharyngeal exudate.  Cardiovascular:     Rate and Rhythm: Normal rate and regular rhythm.     Heart sounds: Normal heart sounds, S1 normal and S2 normal. No murmur heard. Pulmonary:     Effort: Pulmonary effort is normal.     Breath sounds: Normal breath sounds. No wheezing, rhonchi or rales.     Comments: Reactive cough with deep breathing Psychiatric:        Behavior:  Behavior is cooperative.     UC Treatments / Results  Labs (all labs ordered  are listed, but only abnormal results are displayed) Labs Reviewed - No data to display  EKG   Radiology DG Chest 2 View  Result Date: 01/19/2022 CLINICAL DATA:  2 + weeks of cough EXAM: CHEST - 2 VIEW COMPARISON:  None. FINDINGS: The cardiomediastinal silhouette is normal in contour. No pleural effusion. No pneumothorax. No acute pleuroparenchymal abnormality. Visualized abdomen is unremarkable. No acute osseous abnormality noted. IMPRESSION: No acute cardiopulmonary abnormality. Electronically Signed   By: Meda KlinefelterStephanie  Peacock M.D.   On: 01/19/2022 16:39    Procedures Procedures (including critical care time)  Medications Ordered in UC Medications  albuterol (VENTOLIN HFA) 108 (90 Base) MCG/ACT inhaler 2 puff (2 puffs Inhalation Given 01/19/22 1604)  predniSONE (DELTASONE) tablet 40 mg (40 mg Oral Given 01/19/22 1610)    Initial Impression / Assessment and Plan / UC Course  I have reviewed the triage vital signs and the nursing notes.  Pertinent labs & imaging results that were available during my care of the patient were reviewed by me and considered in my medical decision making (see chart for details).     X-ray obtained given prolonged history of cough showed no acute cardiopulmonary disease.  Concern for sinobronchitis given prolonged and worsening symptoms.  Patient was given albuterol and prednisone in clinic.  She was initially prescribed Solu-Medrol but declined injection and prednisone was released.  We had available p.o.  She does report significant improvement of symptoms with this medication.  We will continue course of prednisone burst with an additional 4 days of 40 mg and she was instructed not to take NSAIDs with this medication due to risk of GI bleeding.  She was started on Augmentin to cover for infection.  Recommended she use Mucinex, Flonase, Tylenol for additional symptom relief.  She  was given Promethazine DM for cough with instruction not to drive or drink alcohol while taking this medication as drowsiness is a common side effect.  Discussed that if symptoms are improving by next week she should return here or see her PCP.  If anything worsens and she has any chest pain, shortness of breath, high fever not responding to medication, worsening cough she needs to go to the emergency room.  Strict return precautions given to which she expressed understanding.  Work excuse note provided.  Final Clinical Impressions(s) / UC Diagnoses   Final diagnoses:  Sinobronchitis  Acute cough     Discharge Instructions      Your chest x-ray was normal.  I believe that you have a sinobronchitis.  Start Augmentin twice daily for 7 days to cover for infection.  We gave you some prednisone today and I would like you to take 40 mg in the morning for the next 4 days.  Do not take NSAIDs including aspirin, ibuprofen/Advil, naproxen/Aleve with this medication as can cause stomach bleeding.  You can use Tylenol, Mucinex, Flonase for additional symptom relief.  I have called in Promethazine DM for cough.  This will make you sleepy so do not drive or drink alcohol while taking it.  Make sure to rest and drink plenty of fluid.  If your symptoms have not improved by early next week follow-up with our clinic or your primary care provider.  If anything worsens and you have high fever, worsening shortness of breath, persistent cough, nausea/vomiting interfering with oral intake, weakness you need to go to the emergency room.     ED Prescriptions     Medication Sig Dispense Auth. Provider   predniSONE (  DELTASONE) 20 MG tablet Take 2 tablets (40 mg total) by mouth daily for 4 days. 8 tablet Kijana Cromie K, PA-C   amoxicillin-clavulanate (AUGMENTIN) 875-125 MG tablet Take 1 tablet by mouth every 12 (twelve) hours. 14 tablet Lafaye Mcelmurry K, PA-C   promethazine-dextromethorphan (PROMETHAZINE-DM) 6.25-15 MG/5ML  syrup Take 5 mLs by mouth 3 (three) times daily as needed for cough. 118 mL Mae Cianci K, PA-C      PDMP not reviewed this encounter.   Jeani Hawking, PA-C 01/19/22 1703

## 2022-01-19 NOTE — Discharge Instructions (Addendum)
Your chest x-ray was normal.  I believe that you have a sinobronchitis.  Start Augmentin twice daily for 7 days to cover for infection.  We gave you some prednisone today and I would like you to take 40 mg in the morning for the next 4 days.  Do not take NSAIDs including aspirin, ibuprofen/Advil, naproxen/Aleve with this medication as can cause stomach bleeding.  You can use Tylenol, Mucinex, Flonase for additional symptom relief.  I have called in Promethazine DM for cough.  This will make you sleepy so do not drive or drink alcohol while taking it.  Make sure to rest and drink plenty of fluid.  If your symptoms have not improved by early next week follow-up with our clinic or your primary care provider.  If anything worsens and you have high fever, worsening shortness of breath, persistent cough, nausea/vomiting interfering with oral intake, weakness you need to go to the emergency room.

## 2022-01-22 DIAGNOSIS — Z419 Encounter for procedure for purposes other than remedying health state, unspecified: Secondary | ICD-10-CM | POA: Diagnosis not present

## 2022-01-31 ENCOUNTER — Ambulatory Visit (HOSPITAL_COMMUNITY)
Admission: EM | Admit: 2022-01-31 | Discharge: 2022-01-31 | Disposition: A | Discharge status: Home / Self Care | Payer: 59 | Attending: Physician Assistant | Admitting: Physician Assistant

## 2022-01-31 ENCOUNTER — Encounter (HOSPITAL_COMMUNITY): Payer: Self-pay | Admitting: Emergency Medicine

## 2022-01-31 ENCOUNTER — Other Ambulatory Visit: Payer: Self-pay

## 2022-01-31 DIAGNOSIS — R051 Acute cough: Secondary | ICD-10-CM

## 2022-01-31 DIAGNOSIS — J069 Acute upper respiratory infection, unspecified: Secondary | ICD-10-CM | POA: Diagnosis not present

## 2022-01-31 LAB — POC INFLUENZA A AND B ANTIGEN (URGENT CARE ONLY)
INFLUENZA A ANTIGEN, POC: NEGATIVE
INFLUENZA B ANTIGEN, POC: NEGATIVE

## 2022-01-31 MED ORDER — PROMETHAZINE-DM 6.25-15 MG/5ML PO SYRP: 5.0000 mL | ORAL_SOLUTION | Freq: Three times a day (TID) | ORAL | 0 refills | Status: DC | PRN

## 2022-01-31 MED ORDER — PREDNISONE 10 MG (21) PO TBPK: ORAL_TABLET | ORAL | 0 refills | Status: DC

## 2022-01-31 NOTE — ED Triage Notes (Signed)
Pt reports cough and nasal congestion x 4 days. Pt reports cough causing pt to not sleep.

## 2022-01-31 NOTE — Discharge Instructions (Signed)
Your flu testing was negative.  I believe that you have a virus.  Please use prednisone to help with your symptoms.  Do not take NSAIDs including aspirin, ibuprofen/Advil, naproxen/Aleve with this medication as it can cause stomach bleeding.  You can use Tylenol, Mucinex, Flonase for symptom relief.  Continue Promethazine DM for cough.  This can make you sleepy so do not drive or drink alcohol while taking it.  Make sure you rest and drink plenty of fluid.  If you have any worsening symptoms including high fever, chest pain, shortness of breath, worsening cough, nausea/vomiting interfering with oral intake you need to be seen immediately.  If symptoms do not improve by next week please return here or see your PCP.
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
## 2022-02-19 DIAGNOSIS — Z419 Encounter for procedure for purposes other than remedying health state, unspecified: Secondary | ICD-10-CM | POA: Diagnosis not present

## 2022-03-22 DIAGNOSIS — Z419 Encounter for procedure for purposes other than remedying health state, unspecified: Secondary | ICD-10-CM | POA: Diagnosis not present

## 2022-04-21 DIAGNOSIS — Z419 Encounter for procedure for purposes other than remedying health state, unspecified: Secondary | ICD-10-CM | POA: Diagnosis not present

## 2022-05-22 DIAGNOSIS — Z419 Encounter for procedure for purposes other than remedying health state, unspecified: Secondary | ICD-10-CM | POA: Diagnosis not present

## 2022-06-21 DIAGNOSIS — Z419 Encounter for procedure for purposes other than remedying health state, unspecified: Secondary | ICD-10-CM | POA: Diagnosis not present

## 2022-07-22 DIAGNOSIS — Z419 Encounter for procedure for purposes other than remedying health state, unspecified: Secondary | ICD-10-CM | POA: Diagnosis not present

## 2022-08-22 DIAGNOSIS — Z419 Encounter for procedure for purposes other than remedying health state, unspecified: Secondary | ICD-10-CM | POA: Diagnosis not present

## 2022-09-21 DIAGNOSIS — Z419 Encounter for procedure for purposes other than remedying health state, unspecified: Secondary | ICD-10-CM | POA: Diagnosis not present

## 2022-10-22 DIAGNOSIS — Z419 Encounter for procedure for purposes other than remedying health state, unspecified: Secondary | ICD-10-CM | POA: Diagnosis not present

## 2022-11-21 DIAGNOSIS — Z419 Encounter for procedure for purposes other than remedying health state, unspecified: Secondary | ICD-10-CM | POA: Diagnosis not present

## 2022-12-14 ENCOUNTER — Other Ambulatory Visit: Payer: Self-pay

## 2022-12-14 ENCOUNTER — Emergency Department (HOSPITAL_COMMUNITY)
Admission: EM | Admit: 2022-12-14 | Discharge: 2022-12-14 | Disposition: A | Discharge status: Home / Self Care | Payer: 59 | Attending: Emergency Medicine | Admitting: Emergency Medicine

## 2022-12-14 ENCOUNTER — Encounter (HOSPITAL_COMMUNITY): Payer: Self-pay

## 2022-12-14 ENCOUNTER — Emergency Department (HOSPITAL_COMMUNITY): Payer: 59

## 2022-12-14 DIAGNOSIS — S0181XA Laceration without foreign body of other part of head, initial encounter: Secondary | ICD-10-CM | POA: Diagnosis not present

## 2022-12-14 DIAGNOSIS — Z23 Encounter for immunization: Secondary | ICD-10-CM | POA: Diagnosis not present

## 2022-12-14 DIAGNOSIS — S0121XA Laceration without foreign body of nose, initial encounter: Secondary | ICD-10-CM | POA: Insufficient documentation

## 2022-12-14 DIAGNOSIS — S0992XA Unspecified injury of nose, initial encounter: Secondary | ICD-10-CM | POA: Diagnosis present

## 2022-12-14 DIAGNOSIS — S022XXA Fracture of nasal bones, initial encounter for closed fracture: Secondary | ICD-10-CM | POA: Diagnosis not present

## 2022-12-14 MED ORDER — TETANUS-DIPHTH-ACELL PERTUSSIS 5-2.5-18.5 LF-MCG/0.5 IM SUSY
0.5000 mL | PREFILLED_SYRINGE | Freq: Once | INTRAMUSCULAR | Status: AC
  Administered 2022-12-14: 0.5 mL via INTRAMUSCULAR
  Filled 2022-12-14: qty 0.5

## 2022-12-14 MED ORDER — LIDOCAINE-EPINEPHRINE-TETRACAINE (LET) TOPICAL GEL
3.0000 mL | Freq: Once | TOPICAL | Status: AC
  Administered 2022-12-14: 3 mL via TOPICAL
  Filled 2022-12-14: qty 3

## 2022-12-14 MED ORDER — LIDOCAINE HCL (PF) 1 % IJ SOLN
5.0000 mL | Freq: Once | INTRAMUSCULAR | Status: AC
  Administered 2022-12-14: 5 mL
  Filled 2022-12-14: qty 5

## 2022-12-14 MED ORDER — BACITRACIN ZINC 500 UNIT/GM EX OINT
TOPICAL_OINTMENT | Freq: Two times a day (BID) | CUTANEOUS | Status: DC
  Administered 2022-12-14: 1 via TOPICAL
  Filled 2022-12-14: qty 1.8

## 2022-12-14 MED ORDER — CEPHALEXIN 500 MG PO CAPS: 500.0000 mg | ORAL_CAPSULE | Freq: Two times a day (BID) | ORAL | 0 refills | Status: AC

## 2022-12-14 MED ORDER — OXYCODONE-ACETAMINOPHEN 5-325 MG PO TABS
1.0000 | ORAL_TABLET | Freq: Once | ORAL | Status: AC
  Administered 2022-12-14: 1 via ORAL
  Filled 2022-12-14: qty 1
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
## 2022-12-14 NOTE — Discharge Instructions (Addendum)
Keep the area clean with warm soap and water dab dry and apply bacitracin ointment or Vaseline to keep the area clean.  Take the prophylactic antibiotics for the full course until the pills are completely gone.  Please use Tylenol or ibuprofen for pain.  You may use 600 mg ibuprofen every 6 hours or 1000 mg of Tylenol every 6 hours.  You may choose to alternate between the 2.  This would be most effective.  Not to exceed 4 g of Tylenol within 24 hours.  Not to exceed 3200 mg ibuprofen 24 hours.   You do have a nasal bone fracture.  This should heal on its own.  If you are going to blow your nose try not to occlude 1 nostril.  Ice to the bridge of your nose can help with pain and swelling

## 2022-12-22 DIAGNOSIS — Z419 Encounter for procedure for purposes other than remedying health state, unspecified: Secondary | ICD-10-CM | POA: Diagnosis not present

## 2023-01-21 ENCOUNTER — Encounter (HOSPITAL_COMMUNITY): Payer: Self-pay

## 2023-01-21 ENCOUNTER — Ambulatory Visit (HOSPITAL_COMMUNITY)
Admission: EM | Admit: 2023-01-21 | Discharge: 2023-01-21 | Disposition: A | Discharge status: Home / Self Care | Payer: Commercial Managed Care - PPO | Attending: Family Medicine | Admitting: Family Medicine

## 2023-01-21 DIAGNOSIS — R519 Headache, unspecified: Secondary | ICD-10-CM

## 2023-01-21 MED ORDER — DEXAMETHASONE SODIUM PHOSPHATE 10 MG/ML IJ SOLN
10.0000 mg | Freq: Once | INTRAMUSCULAR | Status: AC
  Administered 2023-01-21: 10 mg via INTRAMUSCULAR

## 2023-01-21 MED ORDER — KETOROLAC TROMETHAMINE 60 MG/2ML IM SOLN
INTRAMUSCULAR | Status: AC
  Filled 2023-01-21: qty 2

## 2023-01-21 MED ORDER — DEXAMETHASONE SODIUM PHOSPHATE 10 MG/ML IJ SOLN
INTRAMUSCULAR | Status: AC
  Filled 2023-01-21: qty 1

## 2023-01-21 MED ORDER — KETOROLAC TROMETHAMINE 60 MG/2ML IM SOLN
60.0000 mg | Freq: Once | INTRAMUSCULAR | Status: AC
  Administered 2023-01-21: 60 mg via INTRAMUSCULAR

## 2023-01-21 NOTE — ED Triage Notes (Signed)
Pt states frontal headache today took motrin and it didn't help.  Pt states she took her BP and it was 141/80.

## 2023-01-21 NOTE — ED Provider Notes (Signed)
[place]   [id number] 01/21/23 Arrival Time: [id number]  ASSESSMENT & PLAN:  1. Bad headache    Meds ordered this encounter  Medications   dexamethasone (DECADRON) injection 10 mg   ketorolac (TORADOL) injection 60 mg   Normal neurological exam. Afebrile without nuchal rigidity. No indication for neurodiagnostic workup at this time. Suspect elevated BP described is in response to her headaches. Is in process of est care with PCP.  Hopefully above will give her some relief. Agrees to ED evaluation should HA worsen in any way or should she experience worst HA of life. Discussed with her.  Recommend:  Follow-up Information     [facility].   Specialty: Emergency Medicine Why: If symptoms worsen in any way. Contact information: [address] [id number] [place] [phone]                 Reviewed expectations re: course of current medical issues. Questions answered. Outlined signs and symptoms indicating need for more acute intervention. Patient verbalized understanding. After Visit Summary given.   SUBJECTIVE: History from: Patient Patient is able to give a clear and coherent history.  Becky Crosby is a 22 y.o. female who presents with complaint of a bad headache. Has been getting random headaches over past 1 year. Feels BP is elevated with headaches. Current headache began this morning while at work; mostly frontal and superior scalp on the R mainly. No assoc visual changes/n/v/photophobia. BP at work was 141/80. Normal here but still feeling the headache; no worsening. Is ambulatory.No specific aggravating or alleviating factors reported. Afebrile. No recent illnesses. No allergy problems. Not the worst headache of her life. No extremity sensation changes or weakness. Denies head injury.  OBJECTIVE:  Vitals:   01/21/23 1614  BP: 118/64  Pulse: 63  Resp: 16  Temp: 97.9 F  (36.6 C)  TempSrc: Oral  SpO2: 98%    General appearance: alert; NAD HENT: normocephalic; atraumatic Eyes: PERRLA; EOMI; conjunctivae normal Neck: supple with FROM Lungs: clear to auscultation bilaterally; unlabored respirations Heart: regular rate and rhythm Extremities: no edema; symmetrical with no gross deformities Skin: warm and dry Neurologic: alert; speech is fluent and clear without dysarthria or aphasia; CN 2-12 grossly intact; no facial droop; normal gait Psychological: alert and cooperative; normal mood and affect  No Known Allergies  Past Medical History:  Diagnosis Date   Constipation    Medical history non-contributory    Social History   Socioeconomic History   Marital status: Single    Spouse name: Not on file   Number of children: Not on file   Years of education: Not on file   Highest education level: Not on file  Occupational History   Not on file  Tobacco Use   Smoking status: Never   Smokeless tobacco: Never  Vaping Use   Vaping Use: Never used  Substance and Sexual Activity   Alcohol use: Not Currently   Drug use: Not Currently   Sexual activity: Yes  Other Topics Concern   Not on file  Social History Narrative   Not on file   Social Determinants of Health   Financial Resource Strain: Not on file  Food Insecurity: Not on file  Transportation Needs: Not on file  Physical Activity: Not on file  Stress: Not on file  Social Connections: Not on file  Intimate Partner Violence: Not on file   Family History  Problem Relation Age of Onset  Hypertension Mother    Hypertension Maternal Grandmother    Hypertension Maternal Aunt    Cancer Maternal Aunt    Past Surgical History:  Procedure Laterality Date   NO PAST SURGERIES        Vanessa Kick, MD 01/21/23 [phone]

## 2023-01-21 NOTE — Discharge Instructions (Signed)
Meds ordered this encounter  Medications   dexamethasone (DECADRON) injection 10 mg   ketorolac (TORADOL) injection 60 mg    

## 2023-01-22 DIAGNOSIS — Z419 Encounter for procedure for purposes other than remedying health state, unspecified: Secondary | ICD-10-CM | POA: Diagnosis not present

## 2023-02-02 DIAGNOSIS — R0982 Postnasal drip: Secondary | ICD-10-CM | POA: Diagnosis not present

## 2023-02-02 DIAGNOSIS — Z566 Other physical and mental strain related to work: Secondary | ICD-10-CM | POA: Diagnosis not present

## 2023-02-02 DIAGNOSIS — J069 Acute upper respiratory infection, unspecified: Secondary | ICD-10-CM | POA: Diagnosis not present

## 2023-02-02 DIAGNOSIS — Z03818 Encounter for observation for suspected exposure to other biological agents ruled out: Secondary | ICD-10-CM | POA: Diagnosis not present

## 2023-02-02 DIAGNOSIS — G44209 Tension-type headache, unspecified, not intractable: Secondary | ICD-10-CM | POA: Diagnosis not present

## 2023-02-02 DIAGNOSIS — J029 Acute pharyngitis, unspecified: Secondary | ICD-10-CM | POA: Diagnosis not present

## 2023-02-03 ENCOUNTER — Ambulatory Visit (HOSPITAL_COMMUNITY): Payer: Commercial Managed Care - PPO

## 2023-02-05 ENCOUNTER — Other Ambulatory Visit (HOSPITAL_COMMUNITY): Payer: Self-pay

## 2023-02-05 DIAGNOSIS — J069 Acute upper respiratory infection, unspecified: Secondary | ICD-10-CM | POA: Diagnosis not present

## 2023-02-05 DIAGNOSIS — H1089 Other conjunctivitis: Secondary | ICD-10-CM | POA: Diagnosis not present

## 2023-02-05 DIAGNOSIS — G44209 Tension-type headache, unspecified, not intractable: Secondary | ICD-10-CM | POA: Diagnosis not present

## 2023-02-05 MED ORDER — INDOMETHACIN 50 MG PO CAPS
50.0000 mg | ORAL_CAPSULE | Freq: Every morning | ORAL | 0 refills | Status: DC
  Filled 2023-02-05: qty 30, 30d supply, fill #0

## 2023-02-05 MED ORDER — POLYMYXIN B-TRIMETHOPRIM 10000-0.1 UNIT/ML-% OP SOLN
1.0000 [drp] | Freq: Four times a day (QID) | OPHTHALMIC | 0 refills | Status: DC
  Filled 2023-02-05: qty 10, 10d supply, fill #0

## 2023-02-05 MED ORDER — AZITHROMYCIN 250 MG PO TABS
ORAL_TABLET | ORAL | 0 refills | Status: DC
  Filled 2023-02-05: qty 6, 5d supply, fill #0

## 2023-02-06 ENCOUNTER — Other Ambulatory Visit (HOSPITAL_COMMUNITY): Payer: Self-pay

## 2023-02-06 MED ORDER — PROMETHAZINE-PHENYLEPHRINE 6.25-5 MG/5ML PO SYRP
5.0000 mL | ORAL_SOLUTION | Freq: Four times a day (QID) | ORAL | 0 refills | Status: DC | PRN
  Filled 2023-02-06: qty 140, 7d supply, fill #0

## 2023-02-09 ENCOUNTER — Other Ambulatory Visit: Payer: Self-pay

## 2023-02-09 DIAGNOSIS — G44209 Tension-type headache, unspecified, not intractable: Secondary | ICD-10-CM | POA: Diagnosis not present

## 2023-02-09 DIAGNOSIS — Z6841 Body Mass Index (BMI) 40.0 and over, adult: Secondary | ICD-10-CM | POA: Diagnosis not present

## 2023-02-09 DIAGNOSIS — F411 Generalized anxiety disorder: Secondary | ICD-10-CM | POA: Diagnosis not present

## 2023-02-09 DIAGNOSIS — H1031 Unspecified acute conjunctivitis, right eye: Secondary | ICD-10-CM | POA: Diagnosis not present

## 2023-02-09 DIAGNOSIS — Z7689 Persons encountering health services in other specified circumstances: Secondary | ICD-10-CM | POA: Diagnosis not present

## 2023-02-09 DIAGNOSIS — J069 Acute upper respiratory infection, unspecified: Secondary | ICD-10-CM | POA: Diagnosis not present

## 2023-02-20 DIAGNOSIS — Z419 Encounter for procedure for purposes other than remedying health state, unspecified: Secondary | ICD-10-CM | POA: Diagnosis not present

## 2023-03-23 DIAGNOSIS — Z419 Encounter for procedure for purposes other than remedying health state, unspecified: Secondary | ICD-10-CM | POA: Diagnosis not present

## 2023-03-24 ENCOUNTER — Encounter (HOSPITAL_COMMUNITY): Payer: Self-pay

## 2023-03-24 ENCOUNTER — Ambulatory Visit (HOSPITAL_COMMUNITY)
Admission: RE | Admit: 2023-03-24 | Discharge: 2023-03-24 | Disposition: A | Discharge status: Home / Self Care | Payer: Commercial Managed Care - PPO | Source: Ambulatory Visit | Attending: Physician Assistant | Admitting: Physician Assistant

## 2023-03-24 VITALS — BP 107/75 | HR 66 | Temp 97.6°F | Resp 16

## 2023-03-24 DIAGNOSIS — R051 Acute cough: Secondary | ICD-10-CM

## 2023-03-24 DIAGNOSIS — J069 Acute upper respiratory infection, unspecified: Secondary | ICD-10-CM

## 2023-03-24 DIAGNOSIS — J040 Acute laryngitis: Secondary | ICD-10-CM | POA: Diagnosis not present

## 2023-03-24 MED ORDER — PREDNISONE 20 MG PO TABS: 40.0000 mg | ORAL_TABLET | Freq: Every day | ORAL | 0 refills | Status: AC

## 2023-03-24 MED ORDER — PROMETHAZINE-DM 6.25-15 MG/5ML PO SYRP: 5.0000 mL | ORAL_SOLUTION | Freq: Three times a day (TID) | ORAL | 0 refills | Status: AC | PRN

## 2023-03-24 MED ORDER — FLUTICASONE PROPIONATE 50 MCG/ACT NA SUSP: 1.0000 | Freq: Every day | NASAL | 0 refills | Status: AC

## 2023-03-24 NOTE — ED Triage Notes (Signed)
Patient c/o sore throat, hoarseness, a productive cough with yellow sputum, and nasal congestion x 4 days.  Patient states she has been taking Zyrtec, Emergen-C, tea with honey, and Ibuprofen.  Patient states she last had ibuprofen last night.

## 2023-03-24 NOTE — ED Provider Notes (Signed)
[place]    CSN: [id number] Arrival date & time: 03/24/23  1222      History   Chief Complaint Chief Complaint  Patient presents with   Sore Throat   Cough   Hoarse    HPI Becky Crosby is a 22 y.o. female.   Patient presents today with a 3 to 4-day history of URI symptoms.  Reports cough, hoarseness, sore throat, nasal congestion.  Denies any fever, chest pain, shortness of breath, nausea, vomiting.  Denies any known sick contacts.  She does have a toddler but reports that they have been well.  She did take an at-home COVID test that was negative.  She has tried over-the-counter medication including cetirizine, vitamin C, tea, honey, ibuprofen with minimal improvement of symptoms.  She denies any recent antibiotics or steroids.  She is confident that she is not pregnant.  She denies history of diabetes.  Denies history of asthma, COPD, smoking; reports that she is a very occasional smoker in social environments but does not use tobacco products regularly.    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 Authorizing Provider  fluticasone (FLONASE) 50 MCG/ACT nasal spray Place 1 spray into both nostrils daily. 03/24/23  Yes Monte Zinni K, PA-C  predniSONE (DELTASONE) 20 MG tablet Take 2 tablets (40 mg total)  by mouth daily for 5 days. 03/24/23 03/29/23 Yes Allister Lessley K, PA-C  promethazine-dextromethorphan (PROMETHAZINE-DM) 6.25-15 MG/5ML syrup Take 5 mLs by mouth 3 (three) times daily as needed for cough. 03/24/23  Yes Kali Deadwyler, Derry Skill, PA-C    Family History Family History  Problem Relation Age of Onset   Hypertension Mother    Hypertension Maternal Grandmother    Hypertension Maternal Aunt    Cancer Maternal Aunt     Social History Social History   Tobacco Use   Smoking status: Never   Smokeless tobacco: Never  Vaping Use   Vaping Use: Some days   Substances: Nicotine, Flavoring  Substance Use Topics   Alcohol use: Not Currently   Drug use: Not Currently     Allergies   Patient has no known allergies.   Review of Systems Review of Systems  Constitutional:  Positive for activity change. Negative for appetite change, fatigue and fever.  HENT:  Positive for congestion. Negative for sinus pressure, sneezing and sore throat.   Respiratory:  Positive for cough. Negative for shortness of breath.   Cardiovascular:  Negative for chest pain.  Gastrointestinal:  Negative for abdominal pain,  diarrhea, nausea and vomiting.  Neurological:  Negative for dizziness, light-headedness and headaches.     Physical Exam Triage Vital Signs ED Triage Vitals  Enc Vitals Group     BP 03/24/23 1301 107/75     Pulse Rate 03/24/23 1301 66     Resp 03/24/23 1301 16     Temp 03/24/23 1301 97.6 F (36.4 C)     Temp Source 03/24/23 1301 Oral     SpO2 03/24/23 1301 98 %     Weight --      Height --      Head Circumference --      Peak Flow --      Pain Score 03/24/23 1302 6     Pain Loc --      Pain Edu? --      Excl. in [place]? --    No data found.  Updated Vital Signs BP 107/75 (BP Location: Left Arm)   Pulse 66   Temp 97.6 F (36.4 C) (Oral)   Resp 16   SpO2 98%   Visual Acuity Right Eye Distance:   Left Eye Distance:   Bilateral Distance:    Right Eye Near:   Left Eye Near:     Bilateral Near:     Physical Exam Vitals reviewed.  Constitutional:      General: She is awake. She is not in acute distress.    Appearance: Normal appearance. She is well-developed. She is not ill-appearing.     Comments: Very pleasant female appears stated age in no acute distress sitting comfortably in exam room  HENT:     Head: Normocephalic and atraumatic.     Right Ear: Tympanic membrane, ear canal and external ear normal. Tympanic membrane is not erythematous or bulging.     Left Ear: Tympanic membrane, ear canal and external ear normal. Tympanic membrane is not erythematous or bulging.     Nose:     Right Sinus: No maxillary sinus tenderness or frontal sinus tenderness.     Left Sinus: No maxillary sinus tenderness or frontal sinus tenderness.     Mouth/Throat:     Pharynx: Uvula midline. Posterior oropharyngeal erythema present. No oropharyngeal exudate.     Comments: Erythema and drainage in posterior oropharynx Cardiovascular:     Rate and Rhythm: Normal rate and regular rhythm.     Heart sounds: Normal heart sounds, S1 normal and S2 normal. No murmur heard. Pulmonary:     Effort: Pulmonary effort is normal.     Breath sounds: Normal breath sounds. No wheezing, rhonchi or rales.     Comments: Clear to auscultation bilaterally Psychiatric:        Behavior: Behavior is cooperative.      UC Treatments / Results  Labs (all labs ordered are listed, but only abnormal results are displayed) Labs Reviewed - No data to display  EKG   Radiology No results found.  Procedures Procedures (including critical care time)  Medications Ordered in UC Medications - No data to display  Initial Impression / Assessment and Plan / UC Course  I have reviewed the triage vital signs and the nursing notes.  Pertinent labs & imaging results that were available during my care of the patient were reviewed by me and considered in my medical decision making (see chart for details).      Patient is well-appearing, afebrile, nontoxic, nontachycardic.  Patient has been symptomatic for 3 to 4 days so not a candidate for Tamiflu and therefore flu  testing was deferred.  She has already had a negative COVID test at home so additional testing was deferred.  No evidence of acute infection on physical exam that warrant initiation of antibiotics.  Suspect viral etiology that has triggered allergy symptoms.  She was started on prednisone 40 mg for 5 days with instruction to take NSAIDs.  Recommend that she use over-the-counter antihistamines as well as Flonase to help manage congestion.  She will provided prescription for Promethazine DM for cough.  Discussed this can be sedating and she is not to drive or drink alcohol with taking it.  If her symptoms are improving within a week she is to return for reevaluation.  If she has any worsening symptoms including chest pain, shortness of breath, fever, nausea, vomiting she needs to be seen immediately.  Strict return precautions given.  Work excuse note provided.  Final Clinical Impressions(s) / UC Diagnoses   Final diagnoses:  Upper respiratory tract infection, unspecified type  Acute cough  Laryngitis     Discharge Instructions      I am concerned that you have a virus that is causing you to have a lot of congestion and leading to your symptoms.  Please take over-the-counter antihistamine such as cetirizine daily.  Use Flonase 1 spray in each nostril daily.  Start prednisone 40 mg for 5 days.  Do not take NSAIDs with this medication including aspirin, ibuprofen/Advil, naproxen/Aleve.  Take Promethazine DM for cough.  This will make you sleepy so do not drive or drink alcohol with taking it.  Use a humidifier in your room and make sure that you are drinking plenty of fluid.  If your symptoms do not improve within a week follow-up for reevaluation.  If anything worsens you need to be seen immediately.     ED Prescriptions     Medication Sig  Dispense Auth. Provider   predniSONE (DELTASONE) 20 MG tablet Take 2 tablets (40 mg total) by mouth daily for 5 days. 10 tablet Indya Oliveria K, PA-C   fluticasone (FLONASE) 50 MCG/ACT nasal spray Place 1 spray into both nostrils daily. 16 g Franchesca Veneziano K, PA-C   promethazine-dextromethorphan (PROMETHAZINE-DM) 6.25-15 MG/5ML syrup Take 5 mLs by mouth 3 (three) times daily as needed for cough. 118 mL Drianna Chandran K, PA-C      PDMP not reviewed this encounter.   Terrilee Croak, PA-C 03/24/23 1357

## 2023-03-24 NOTE — Discharge Instructions (Signed)
I am concerned that you have a virus that is causing you to have a lot of congestion and leading to your symptoms.  Please take over-the-counter antihistamine such as cetirizine daily.  Use Flonase 1 spray in each nostril daily.  Start prednisone 40 mg for 5 days.  Do not take NSAIDs with this medication including aspirin, ibuprofen/Advil, naproxen/Aleve.  Take Promethazine DM for cough.  This will make you sleepy so do not drive or drink alcohol with taking it.  Use a humidifier in your room and make sure that you are drinking plenty of fluid.  If your symptoms do not improve within a week follow-up for reevaluation.  If anything worsens you need to be seen immediately.

## 2023-04-22 DIAGNOSIS — Z419 Encounter for procedure for purposes other than remedying health state, unspecified: Secondary | ICD-10-CM | POA: Diagnosis not present

## 2023-05-23 DIAGNOSIS — Z419 Encounter for procedure for purposes other than remedying health state, unspecified: Secondary | ICD-10-CM | POA: Diagnosis not present

## 2023-06-22 DIAGNOSIS — Z419 Encounter for procedure for purposes other than remedying health state, unspecified: Secondary | ICD-10-CM | POA: Diagnosis not present

## 2023-07-22 ENCOUNTER — Ambulatory Visit (HOSPITAL_COMMUNITY)
Admission: EM | Admit: 2023-07-22 | Discharge: 2023-07-22 | Disposition: A | Discharge status: Home / Self Care | Payer: Commercial Managed Care - PPO | Attending: Physician Assistant | Admitting: Physician Assistant

## 2023-07-22 ENCOUNTER — Encounter (HOSPITAL_COMMUNITY): Payer: Self-pay

## 2023-07-22 DIAGNOSIS — B9789 Other viral agents as the cause of diseases classified elsewhere: Secondary | ICD-10-CM | POA: Insufficient documentation

## 2023-07-22 DIAGNOSIS — J069 Acute upper respiratory infection, unspecified: Secondary | ICD-10-CM | POA: Diagnosis not present

## 2023-07-22 DIAGNOSIS — Z1152 Encounter for screening for COVID-19: Secondary | ICD-10-CM | POA: Insufficient documentation

## 2023-07-22 LAB — POCT INFLUENZA A/B
Influenza A, POC: NEGATIVE
Influenza B, POC: NEGATIVE

## 2023-07-22 NOTE — ED Provider Notes (Signed)
[facility]    CSN: [id number] Arrival date & time: 07/22/23  1331      History   Chief Complaint Chief Complaint  Patient presents with   Nasal Congestion    HPI Becky Crosby is a 22 y.o. female.   HPI  She states about 3 days ago she started getting a lot of post nasal drainage and was initially sneezing She states she now has some congestion, increased postnasal drainage, scratchy throat, sore throat with swallowing, dry cough at night,   Last night she started to have stomach cramps  Interventions: warm liquids, allergy relief medications  She reports her boyfriend is having similar symptoms   Past Medical History:  Diagnosis Date   Constipation    Medical history non-contributory     Patient Active Problem List   Diagnosis Date Noted   Post term pregnancy over 40 weeks 08/10/2021   [redacted] weeks gestation of pregnancy 08/07/2021   Intrauterine pregnancy 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 Authorizing Provider  fluticasone (FLONASE) 50 MCG/ACT nasal spray Place 1 spray into both nostrils daily. 03/24/23   Raspet, Noberto Retort, PA-C  promethazine-dextromethorphan (PROMETHAZINE-DM) 6.25-15 MG/5ML syrup Take 5 mLs by mouth 3 (three) times daily as needed for cough. 03/24/23   Raspet, Noberto Retort, PA-C    Family History Family History  Problem Relation Age of Onset   Hypertension Mother    Hypertension Maternal Grandmother    Hypertension Maternal Aunt    Cancer Maternal Aunt     Social History Social History   Tobacco Use   Smoking status: Never    Smokeless tobacco: Never  Vaping Use   Vaping status: Some Days   Substances: Nicotine, Flavoring  Substance Use Topics   Alcohol use: Not Currently   Drug use: Not Currently     Allergies   Patient has no known allergies.   Review of Systems Review of Systems  Constitutional:  Positive for chills. Negative for fatigue and fever.  HENT:  Positive for congestion, postnasal drip, sneezing and sore throat. Negative for ear pain, rhinorrhea, sinus pressure, sinus pain and trouble swallowing.   Respiratory:  Negative for cough, shortness of breath and wheezing.   Gastrointestinal:  Negative for diarrhea, nausea and vomiting.     Physical Exam Triage Vital Signs ED Triage Vitals  Encounter Vitals Group     BP 07/22/23 1516 123/81     Systolic BP Percentile --      Diastolic BP Percentile --      Pulse Rate 07/22/23 1516 76     Resp 07/22/23 1516 16     Temp 07/22/23 1516 97.9 F (36.6 C)     Temp Source 07/22/23 1516 Oral     SpO2 07/22/23 1516 95 %  Weight 07/22/23 1516 290 lb (131.5 kg)     Height 07/22/23 1516 5\' 8"  (1.727 m)     Head Circumference --      Peak Flow --      Pain Score 07/22/23 1515 6     Pain Loc --      Pain Education --      Exclude from Growth Chart --    No data found.  Updated Vital Signs BP 123/81 (BP Location: Left Arm)   Pulse 76   Temp 97.9 F (36.6 C) (Oral)   Resp 16   Ht 5\' 8"  (1.727 m)   Wt 290 lb (131.5 kg)   LMP 07/08/2023 (Approximate)   SpO2 95%   Breastfeeding No   BMI 44.09 kg/m   Visual Acuity Right Eye Distance:   Left Eye Distance:   Bilateral Distance:    Right Eye Near:   Left Eye Near:    Bilateral Near:     Physical Exam Vitals reviewed.  Constitutional:      General: She is awake.     Appearance: Normal appearance. She is well-developed and well-groomed.  HENT:     Head: Normocephalic and atraumatic.     Right Ear: Hearing, tympanic membrane and ear canal normal.     Left Ear: Hearing, tympanic  membrane and ear canal normal.     Mouth/Throat:     Lips: Pink.     Mouth: Mucous membranes are moist.     Pharynx: Uvula midline. Posterior oropharyngeal erythema present. No pharyngeal swelling, oropharyngeal exudate or postnasal drip.     Tonsils: No tonsillar exudate or tonsillar abscesses.  Cardiovascular:     Rate and Rhythm: Normal rate and regular rhythm.     Heart sounds: Normal heart sounds.  Pulmonary:     Effort: Pulmonary effort is normal.     Breath sounds: Normal breath sounds. No decreased air movement. No decreased breath sounds, wheezing, rhonchi or rales.  Musculoskeletal:     Cervical back: Normal range of motion and neck supple.  Lymphadenopathy:     Head:     Right side of head: No submental, submandibular or preauricular adenopathy.     Left side of head: No submental, submandibular or preauricular adenopathy.     Cervical:     Right cervical: No superficial or posterior cervical adenopathy.    Left cervical: No superficial or posterior cervical adenopathy.     Upper Body:     Right upper body: No supraclavicular adenopathy.     Left upper body: No supraclavicular adenopathy.  Neurological:     Mental Status: She is alert.  Psychiatric:        Behavior: Behavior is cooperative.      UC Treatments / Results  Labs (all labs ordered are listed, but only abnormal results are displayed) Labs Reviewed  SARS CORONAVIRUS 2 (TAT 6-24 HRS)  POCT INFLUENZA A/B    EKG   Radiology No results found.  Procedures Procedures (including critical care time)  Medications Ordered in UC Medications - No data to display  Initial Impression / Assessment and Plan / UC Course  I have reviewed the triage vital signs and the nursing notes.  Pertinent labs & imaging results that were available during my care of the patient were reviewed by me and considered in my medical decision making (see chart for details).      Final Clinical Impressions(s) / UC Diagnoses    Final diagnoses:  Viral upper respiratory  tract infection   Visit with patient indicates symptoms comprised of sinus congestion, scratchy throat, chills, stomach cramping, postnasal drainage for the past 3 days  congruent with acute URI that is likely viral in nature  Will test for COVID and flu today- she is outside flu antiviral window but not COVID at this time- results to dictate further management.  Due to nature and duration of symptoms recommended treatment regimen is symptomatic relief and follow up if needed Discussed with patient the various viral and bacterial etiologies of current illness and appropriate course of treatment Discussed OTC medication options for multisymptom relief such as Dayquil/Nyquil, Theraflu, AlkaSeltzer, etc. Recommend Pepcid for upset stomach, suspect this is secondary to drainage  Discussed return precautions if symptoms are not improving or worsen over next 5-7 days.     Discharge Instructions      Based on your described symptoms and the duration of symptoms it is likely that you have a viral upper respiratory infection (often called a "cold")  Symptoms can last for 3-10 days with lingering cough and intermittent symptoms lasting weeks after that.  Your testing results will be available in MyChart over the next 1-2 days and you will be contacted for any positives.   The goal of treatment at this time is to reduce your symptoms and discomfort    You can use over the counter medications such as Dayquil/Nyquil, AlkaSeltzer formulations, etc to provide further relief of symptoms according to the manufacturer's instructions  You can add flonase to help with sinus symptoms as needed.  I recommend taking a medication called Pepcid to help with your stomach cramping and reflux symptoms    If your symptoms do not improve or become worse in the next 5-7 days please make an apt at the office so we can see you  Go to the ER if you begin to have more  serious symptoms such as shortness of breath, trouble breathing, loss of consciousness, swelling around the eyes, high fever, severe lasting headaches, vision changes or neck pain/stiffness.       ED Prescriptions   None    PDMP not reviewed this encounter.   [place] Crosby, PA-C 07/22/23 1605

## 2023-07-22 NOTE — ED Triage Notes (Signed)
Patient here today with c/o throat irritation, PND, dry cough, belly aches from the drainage X 3 days. Patient states that the cold drinks help some. She went to [facility] in but chose not to fill the medication due to possible side effects. Her boyfriend has also been sick. No recent travel.

## 2023-07-22 NOTE — Discharge Instructions (Addendum)
Based on your described symptoms and the duration of symptoms it is likely that you have a viral upper respiratory infection (often called a "cold")  Symptoms can last for 3-10 days with lingering cough and intermittent symptoms lasting weeks after that.  Your testing results will be available in MyChart over the next 1-2 days and you will be contacted for any positives.   The goal of treatment at this time is to reduce your symptoms and discomfort    You can use over the counter medications such as Dayquil/Nyquil, AlkaSeltzer formulations, etc to provide further relief of symptoms according to the manufacturer's instructions  You can add flonase to help with sinus symptoms as needed.  I recommend taking a medication called Pepcid to help with your stomach cramping and reflux symptoms    If your symptoms do not improve or become worse in the next 5-7 days please make an apt at the office so we can see you  Go to the ER if you begin to have more serious symptoms such as shortness of breath, trouble breathing, loss of consciousness, swelling around the eyes, high fever, severe lasting headaches, vision changes or neck pain/stiffness.

## 2023-07-23 DIAGNOSIS — Z419 Encounter for procedure for purposes other than remedying health state, unspecified: Secondary | ICD-10-CM | POA: Diagnosis not present

## 2023-08-05 IMAGING — DX DG CHEST 2V
2 series · 2 of 2 positions shown · non-contrast
Comparison: None.

CLINICAL DATA: 2 + weeks of cough

EXAM:
CHEST - 2 VIEW

[chest pa]
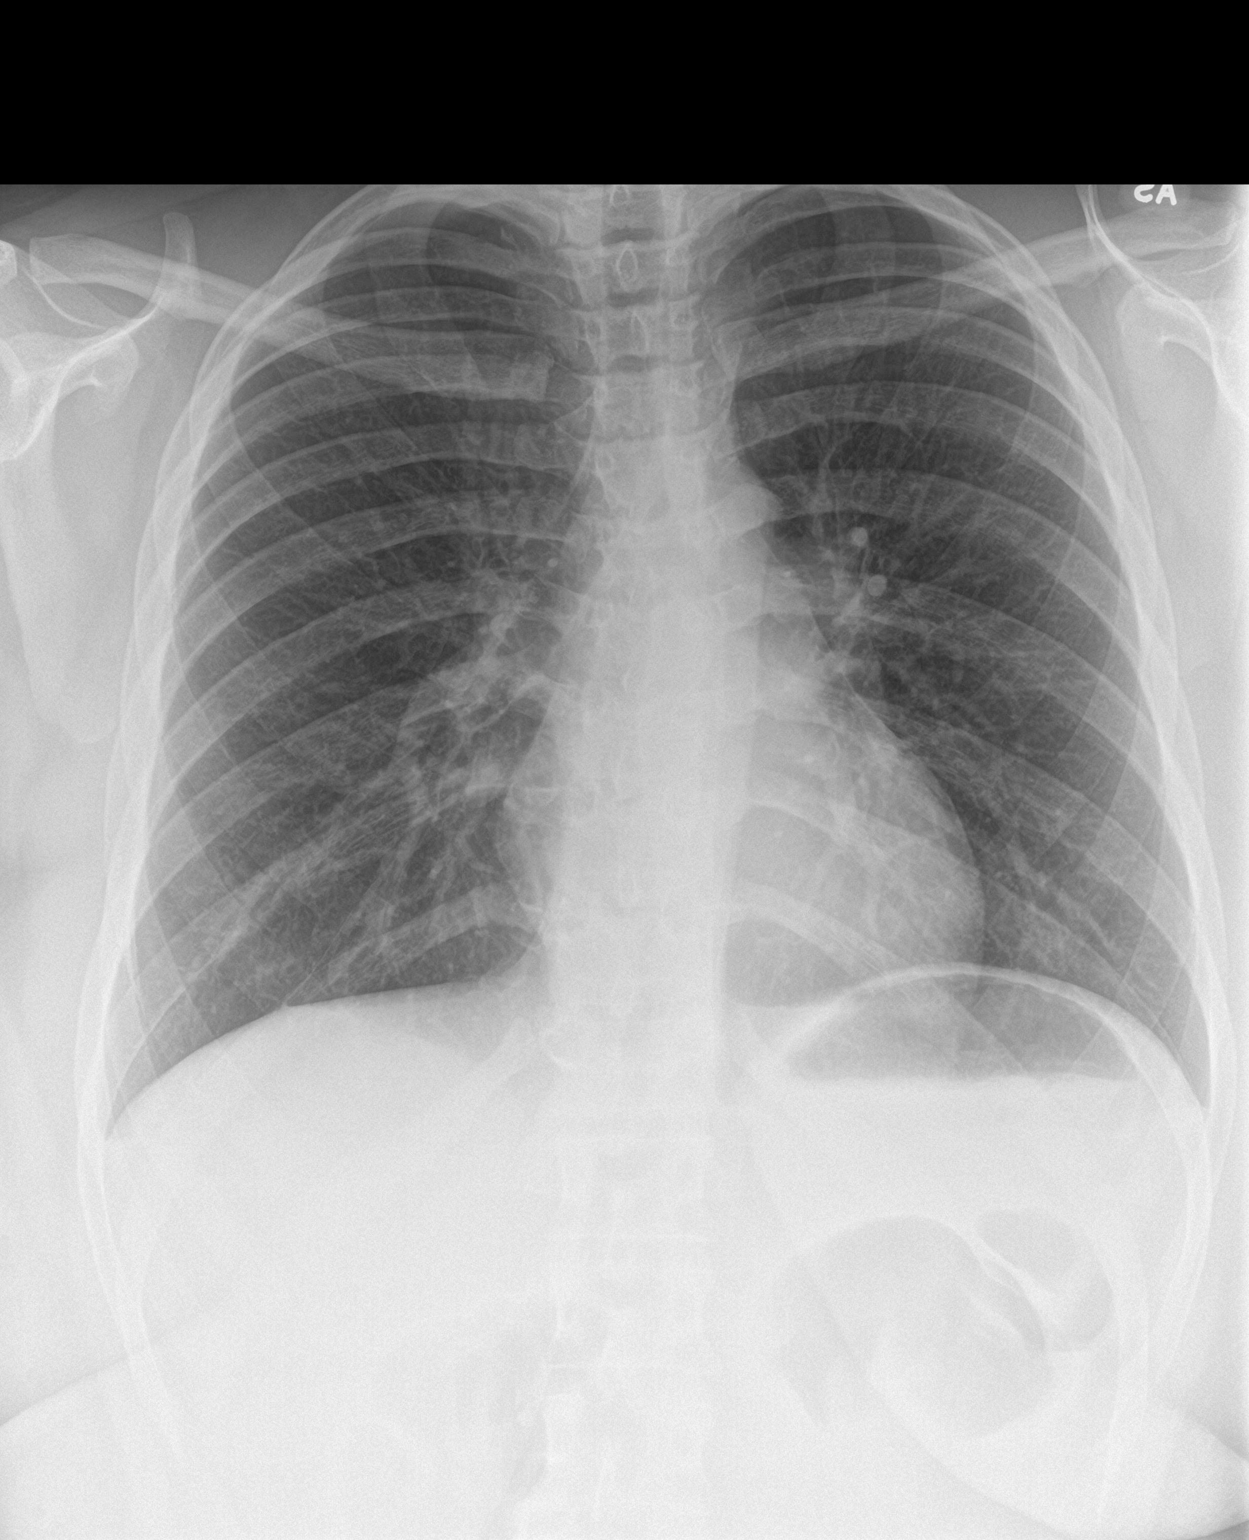

[chest lat]
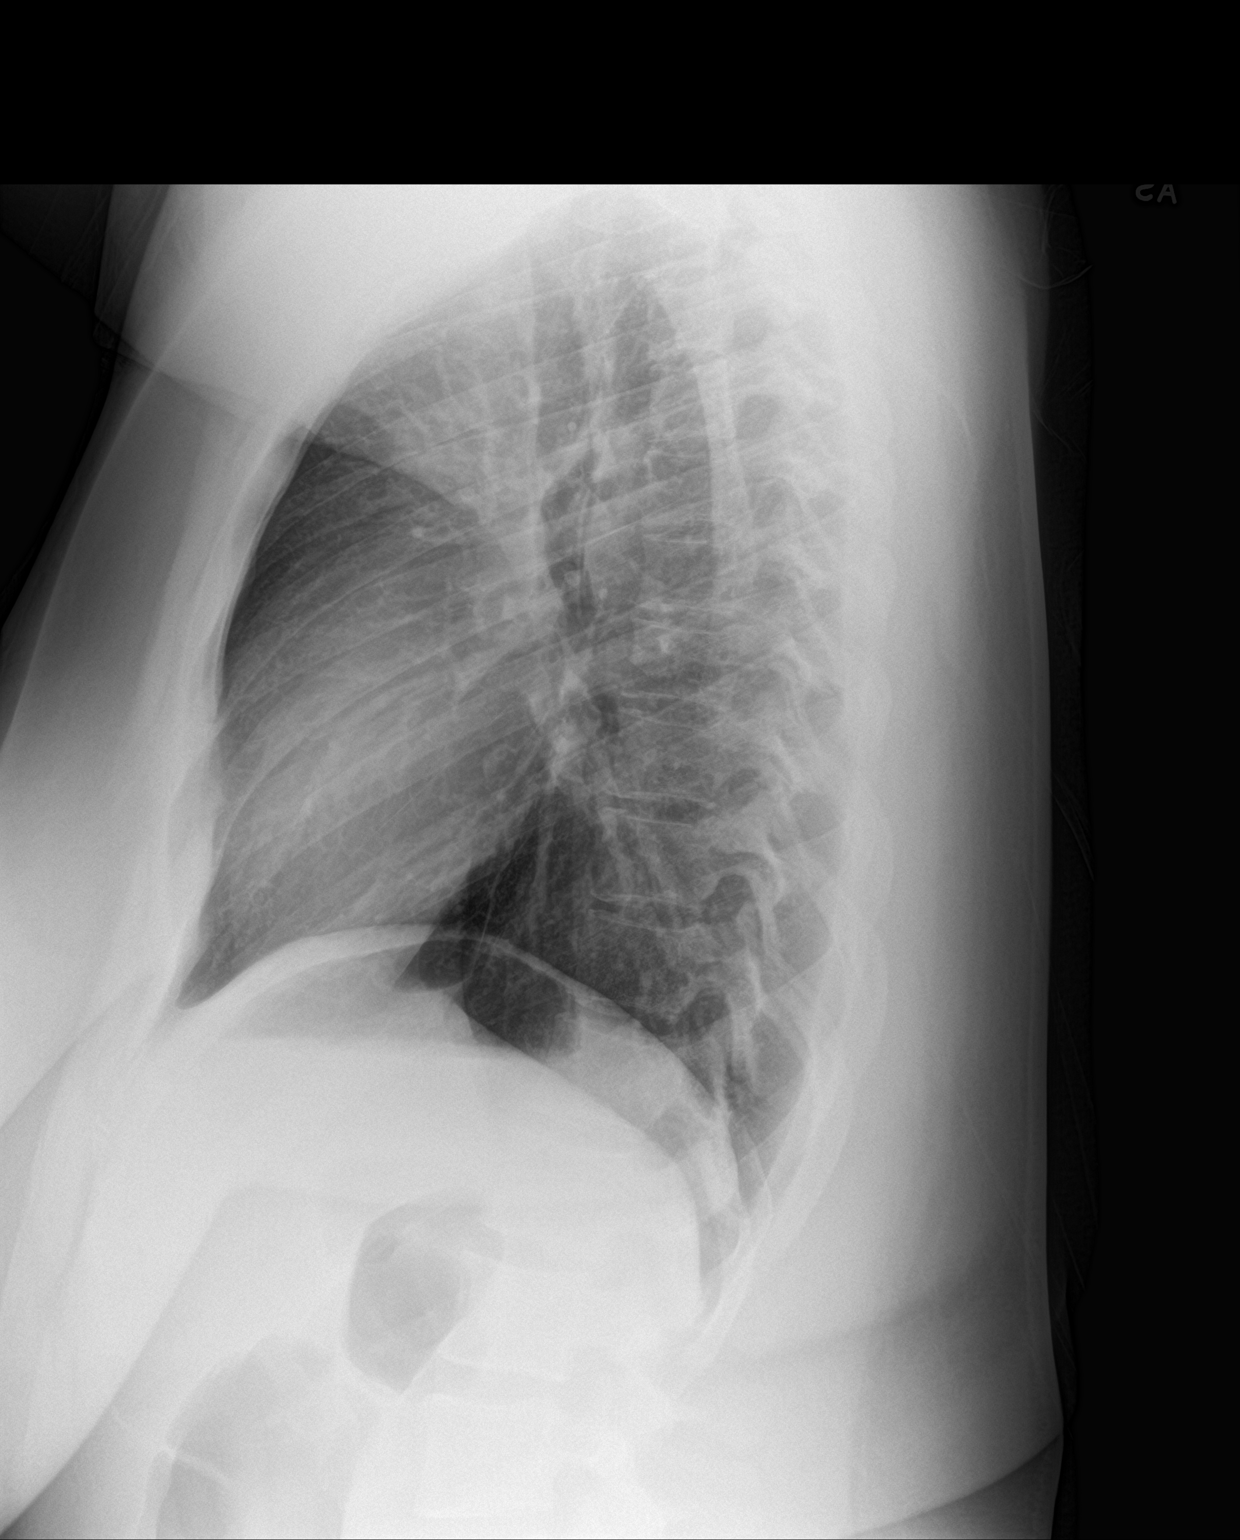

[2 of 2 positions shown; findings below may reference images not displayed]

FINDINGS: The cardiomediastinal silhouette is normal in contour. No pleural
effusion. No pneumothorax. No acute pleuroparenchymal abnormality.
Visualized abdomen is unremarkable. No acute osseous abnormality
noted.
IMPRESSION: No acute cardiopulmonary abnormality.

## 2023-08-23 DIAGNOSIS — Z419 Encounter for procedure for purposes other than remedying health state, unspecified: Secondary | ICD-10-CM | POA: Diagnosis not present

## 2023-09-22 DIAGNOSIS — Z419 Encounter for procedure for purposes other than remedying health state, unspecified: Secondary | ICD-10-CM | POA: Diagnosis not present

## 2023-10-03 ENCOUNTER — Ambulatory Visit (HOSPITAL_COMMUNITY): Payer: Self-pay

## 2023-10-23 DIAGNOSIS — Z419 Encounter for procedure for purposes other than remedying health state, unspecified: Secondary | ICD-10-CM | POA: Diagnosis not present

## 2023-11-22 DIAGNOSIS — Z419 Encounter for procedure for purposes other than remedying health state, unspecified: Secondary | ICD-10-CM | POA: Diagnosis not present

## 2023-12-23 DIAGNOSIS — Z419 Encounter for procedure for purposes other than remedying health state, unspecified: Secondary | ICD-10-CM | POA: Diagnosis not present

## 2024-01-23 DIAGNOSIS — Z419 Encounter for procedure for purposes other than remedying health state, unspecified: Secondary | ICD-10-CM | POA: Diagnosis not present

## 2024-02-20 DIAGNOSIS — Z419 Encounter for procedure for purposes other than remedying health state, unspecified: Secondary | ICD-10-CM | POA: Diagnosis not present

## 2024-04-02 DIAGNOSIS — Z419 Encounter for procedure for purposes other than remedying health state, unspecified: Secondary | ICD-10-CM | POA: Diagnosis not present

## 2024-05-02 DIAGNOSIS — Z419 Encounter for procedure for purposes other than remedying health state, unspecified: Secondary | ICD-10-CM | POA: Diagnosis not present

## 2024-06-02 DIAGNOSIS — Z419 Encounter for procedure for purposes other than remedying health state, unspecified: Secondary | ICD-10-CM | POA: Diagnosis not present

## 2024-07-02 DIAGNOSIS — Z419 Encounter for procedure for purposes other than remedying health state, unspecified: Secondary | ICD-10-CM | POA: Diagnosis not present

## 2024-08-02 DIAGNOSIS — Z419 Encounter for procedure for purposes other than remedying health state, unspecified: Secondary | ICD-10-CM | POA: Diagnosis not present

## 2024-09-02 DIAGNOSIS — Z419 Encounter for procedure for purposes other than remedying health state, unspecified: Secondary | ICD-10-CM | POA: Diagnosis not present

## 2024-09-15 ENCOUNTER — Telehealth: Payer: Self-pay

## 2024-09-16 ENCOUNTER — Ambulatory Visit: Admitting: Physician Assistant

## 2024-09-16 VITALS — BP 110/77 | HR 52 | Ht 69.0 in | Wt 316.6 lb

## 2024-09-16 DIAGNOSIS — Z3046 Encounter for surveillance of implantable subdermal contraceptive: Secondary | ICD-10-CM

## 2024-09-16 MED ORDER — ETONOGESTREL 68 MG ~~LOC~~ IMPL
68.0000 mg | DRUG_IMPLANT | Freq: Once | SUBCUTANEOUS | Status: AC
  Administered 2024-09-16: 68 mg via SUBCUTANEOUS

## 2024-09-16 NOTE — Progress Notes (Signed)
 Pt presents for nexplaono removal/replace. No questions or concerns at this time.

## 2024-09-16 NOTE — Progress Notes (Signed)
     GYNECOLOGY OFFICE PROCEDURE NOTE  Keren Alverio is a 23 y.o. G2P1011 here for Nexplanon  removal and re-insertion. Pap smear never previously done. Patient advised on cervical cancer screening guidelines.  No other gynecologic concerns.   Nexplanon  Removal and Reinsertion Patient identified, informed consent performed, consent signed.   Patient does understand that irregular bleeding is a very common side effect of this medication. She was advised to have backup contraception for one week after replacement of the implant.   Pregnancy test in clinic today was negative.  Appropriate time out taken. Nexplanon  site identified in left arm.  Area prepped in usual sterile fashon. One ml of 1% lidocaine  was used to anesthetize the area at the distal end of the implant. A small stab incision was made right beside the implant on the distal portion. The Nexplanon  rod was grasped using hemostats and removed without difficulty. There was minimal blood loss. There were no complications. Area was then injected with 3 ml of 1 % lidocaine . She was re-prepped with betadine, Nexplanon  removed from packaging, Device confirmed in needle, then inserted full length of needle and withdrawn per handbook instructions. Nexplanon  was able to palpated in the patient's arm; patient palpated the insert herself.  There was minimal blood loss. Patient insertion site covered with gauze and a pressure bandage to reduce any bruising.   The patient tolerated the procedure well and was given post procedure instructions.  She was advised to have backup contraception for one week.     [place], PA-C 09/16/24

## 2024-09-16 NOTE — Addendum Note (Signed)
 Addended by: MARCINE GAINS on: 09/16/2024 12:08 PM   Modules accepted: Orders

## 2024-09-16 NOTE — Patient Instructions (Signed)
 Nexplanon  aftercare: Leave the top bandage on for 24 hours. Keep the smaller bandage clean, dry, and in place for 3 to 5 days.  Call our office immediately if you have:  -Pain in your lower leg that does not go away -Severe chest pain or heaviness in your chest -Sudden shortness of breath -Sharp chest pain coughing blood -Symptoms of severe allergic reaction such as a swollen face, tongue, or throat, or have trouble breathing or swallowing -Sudden severe headache, unlike her usual headaches -Weakness or numbness in your arms or legs or trouble speaking -Sudden blindness -Either partial or complete yellowing of your skin or whites of your eyes, especially with fever, tiredness, loss of appetite, dark-colored urine, or light-colored bowel movements -Severe pain, swelling or tenderness in the lower stomach -Lump in your breast -Problems sleeping, or lack of energy, tiredness, or if you feel very sad -Heavy menstrual bleeding, heavier than usual -Suspect the implant is broken or bent while in your arm

## 2024-10-02 DIAGNOSIS — Z419 Encounter for procedure for purposes other than remedying health state, unspecified: Secondary | ICD-10-CM | POA: Diagnosis not present

## 2024-12-12 DIAGNOSIS — F4312 Post-traumatic stress disorder, chronic: Secondary | ICD-10-CM | POA: Diagnosis not present
